# Patient Record
Sex: Female | Born: 1960 | Race: White | Hispanic: No | Marital: Married | Smoking: Never smoker
Health system: Southern US, Community
[De-identification: ages and names within clinical notes are randomized; demographics above are authoritative.]

## PROBLEM LIST (undated history)

## (undated) DIAGNOSIS — E538 Deficiency of other specified B group vitamins: Secondary | ICD-10-CM

## (undated) HISTORY — DX: Deficiency of other specified B group vitamins: E53.8

## (undated) HISTORY — PX: TUBAL LIGATION: SHX77

---

## 2015-06-04 DIAGNOSIS — Z8669 Personal history of other diseases of the nervous system and sense organs: Secondary | ICD-10-CM

## 2015-06-04 HISTORY — DX: Personal history of other diseases of the nervous system and sense organs: Z86.69

## 2015-10-16 DIAGNOSIS — R200 Anesthesia of skin: Secondary | ICD-10-CM

## 2015-10-16 HISTORY — DX: Anesthesia of skin: R20.0

## 2017-02-27 DIAGNOSIS — Z6823 Body mass index (BMI) 23.0-23.9, adult: Secondary | ICD-10-CM | POA: Diagnosis not present

## 2017-02-27 DIAGNOSIS — R8290 Unspecified abnormal findings in urine: Secondary | ICD-10-CM | POA: Diagnosis not present

## 2017-02-27 DIAGNOSIS — Z1231 Encounter for screening mammogram for malignant neoplasm of breast: Secondary | ICD-10-CM | POA: Diagnosis not present

## 2017-02-27 DIAGNOSIS — Z0000 Encounter for general adult medical examination without abnormal findings: Secondary | ICD-10-CM | POA: Diagnosis not present

## 2017-03-14 ENCOUNTER — Other Ambulatory Visit: Payer: Self-pay | Admitting: Family Medicine

## 2017-03-14 DIAGNOSIS — Z1231 Encounter for screening mammogram for malignant neoplasm of breast: Secondary | ICD-10-CM

## 2017-03-29 DIAGNOSIS — R399 Unspecified symptoms and signs involving the genitourinary system: Secondary | ICD-10-CM | POA: Diagnosis not present

## 2017-04-21 ENCOUNTER — Ambulatory Visit
Admission: RE | Admit: 2017-04-21 | Discharge: 2017-04-21 | Disposition: A | Discharge status: Home / Self Care | Payer: BLUE CROSS/BLUE SHIELD | Source: Ambulatory Visit | Attending: Family Medicine | Admitting: Family Medicine

## 2017-04-21 DIAGNOSIS — Z1231 Encounter for screening mammogram for malignant neoplasm of breast: Secondary | ICD-10-CM

## 2018-03-11 ENCOUNTER — Ambulatory Visit: Payer: BLUE CROSS/BLUE SHIELD | Admitting: Family Medicine

## 2018-03-11 DIAGNOSIS — K219 Gastro-esophageal reflux disease without esophagitis: Secondary | ICD-10-CM | POA: Insufficient documentation

## 2018-03-11 DIAGNOSIS — Z780 Asymptomatic menopausal state: Secondary | ICD-10-CM

## 2018-03-11 DIAGNOSIS — G43909 Migraine, unspecified, not intractable, without status migrainosus: Secondary | ICD-10-CM | POA: Insufficient documentation

## 2018-03-11 HISTORY — DX: Gastro-esophageal reflux disease without esophagitis: K21.9

## 2018-03-11 HISTORY — DX: Asymptomatic menopausal state: Z78.0

## 2018-03-11 HISTORY — DX: Migraine, unspecified, not intractable, without status migrainosus: G43.909

## 2018-03-13 ENCOUNTER — Ambulatory Visit (INDEPENDENT_AMBULATORY_CARE_PROVIDER_SITE_OTHER): Payer: BLUE CROSS/BLUE SHIELD | Admitting: Family Medicine

## 2018-03-13 ENCOUNTER — Encounter: Payer: Self-pay | Admitting: Family Medicine

## 2018-03-13 VITALS — BP 124/86 | HR 61 | Temp 98.5°F | Ht 72.0 in | Wt 168.6 lb

## 2018-03-13 DIAGNOSIS — Z0001 Encounter for general adult medical examination with abnormal findings: Secondary | ICD-10-CM

## 2018-03-13 DIAGNOSIS — Z7689 Persons encountering health services in other specified circumstances: Secondary | ICD-10-CM

## 2018-03-13 DIAGNOSIS — B779 Ascariasis, unspecified: Secondary | ICD-10-CM

## 2018-03-13 DIAGNOSIS — Z8669 Personal history of other diseases of the nervous system and sense organs: Secondary | ICD-10-CM | POA: Diagnosis not present

## 2018-03-13 DIAGNOSIS — Z0000 Encounter for general adult medical examination without abnormal findings: Secondary | ICD-10-CM

## 2018-03-13 LAB — LIPID PANEL
CHOLESTEROL: 199 mg/dL (ref 0–200)
HDL: 76.2 mg/dL (ref >39.00)
LDL Cholesterol: 110 mg/dL — ABNORMAL HIGH (ref 0–99)
NONHDL: 123.2
TRIGLYCERIDES: 65 mg/dL (ref 0.0–149.0)
Total CHOL/HDL Ratio: 3
VLDL: 13 mg/dL (ref 0.0–40.0)

## 2018-03-13 LAB — BASIC METABOLIC PANEL
BUN: 15 mg/dL (ref 6–23)
CALCIUM: 9.4 mg/dL (ref 8.4–10.5)
CO2: 31 mEq/L (ref 19–32)
Chloride: 102 mEq/L (ref 96–112)
Creatinine, Ser: 0.74 mg/dL (ref 0.40–1.20)
GFR: 86.43 mL/min (ref >60.00)
GLUCOSE: 98 mg/dL (ref 70–99)
POTASSIUM: 4.3 meq/L (ref 3.5–5.1)
SODIUM: 140 meq/L (ref 135–145)

## 2018-03-13 LAB — ALT: ALT: 10 U/L (ref 0–35)

## 2018-03-13 LAB — AST: AST: 16 U/L (ref 0–37)

## 2018-03-13 MED ORDER — MEBENDAZOLE 100 MG PO CHEW: 100.0000 mg | CHEWABLE_TABLET | Freq: Two times a day (BID) | ORAL | 2 refills | Status: AC

## 2018-03-13 NOTE — Progress Notes (Signed)
Ann Burgess is a 57 y.o. female here as a new patient to establish care with out office and for her annual physical exam. She is fasting for labs.  Her previous PCP records have been requested. Pt states she is UTD on colonoscopy, PAP, mammo. She does not receive flu vaccine d/t h/o GBS in 2017 (this was not related to flu vaccine).  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 She was told by a colleague that she likely has "worms". No blood in stool. Increased number of BM. Normal appetite. No n/v/d.  History reviewed. No pertinent past medical history.  History reviewed. No pertinent surgical history.  Social History   Socioeconomic History  . Marital status: Married    Spouse name: Not on file  . Number of children: Not on file  . Years of education: Not on file  . Highest education level: Not on file  Occupational History  . Not on file  Social Needs  . Financial resource strain: Not on file  . Food insecurity:    Worry: Not on file    Inability: Not on file  . Transportation needs:    Medical: Not on file    Non-medical: Not on file  Tobacco Use  . Smoking status: Never Smoker  . Smokeless tobacco: Never Used  Substance and Sexual Activity  . Alcohol use: Yes  . Drug use: Never  . Sexual activity: Not on file  Lifestyle  . Physical activity:    Days per week: Not on file    Minutes per session: Not on file  . Stress: Not on file  Relationships  . Social connections:    Talks on phone: Not on file    Gets together: Not on file    Attends religious service: Not on file    Active member of club or organization: Not on file    Attends meetings of clubs or organizations: Not on file    Relationship status: Not on file  . Intimate partner violence:    Fear of current or ex partner: Not on file    Emotionally abused: Not on file    Physically abused: Not on file   Forced sexual activity: Not on file  Other Topics Concern  . Not on file  Social History Narrative  . Not on file   History reviewed. No pertinent family history.  No outpatient encounter medications on file as of 03/13/2018.   No facility-administered encounter medications on file as of 03/13/2018.    Review of Systems  Constitutional: Negative for chills, fever, malaise/fatigue and weight loss.  HENT: Negative for congestion, ear pain, hearing loss, sinus pain, sore throat and tinnitus.   Eyes: Negative for blurred vision and double vision.  Respiratory: Negative for cough, shortness of breath and wheezing.   Cardiovascular: Negative for chest pain, palpitations and leg swelling.  Gastrointestinal: Negative for abdominal pain, blood in stool, constipation, diarrhea, heartburn, nausea and vomiting.  Genitourinary: Negative for dysuria, frequency and urgency.  Musculoskeletal: Negative for joint pain and myalgias.  Skin: Negative for itching and rash.  Neurological: Negative for dizziness, weakness and headaches.  Psychiatric/Behavioral: Negative for depression. The patient is not nervous/anxious and does not have insomnia.      Allergies  Allergen Reactions  . 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  Latex Other (See Comments)    unknown   BP 124/86 (BP Location: Left Arm, Patient Position: Sitting, Cuff Size: Normal)   Pulse 61   Temp 98.5 F (36.9 C) (Oral)   Ht 6' (1.829 m)   Wt 168 lb 9.6 oz (76.5 kg)   SpO2 91%   BMI 22.87 kg/m   General appearance: alert, cooperative, appears stated age and no distress Head: Normocephalic, without obvious abnormality, atraumatic Ears: normal TM's and external ear canals both ears Nose: Nares normal. Septum midline. Mucosa normal. No drainage or sinus tenderness. Throat: lips, mucosa, and tongue normal; teeth and gums normal Neck: no adenopathy, supple, symmetrical, trachea midline  and thyroid not enlarged, symmetric, no tenderness/mass/nodules Back: symmetric, no curvature. ROM normal. No CVA tenderness. Lungs: clear to auscultation bilaterally Heart: regular rate and rhythm and S1, S2 normal Abdomen: soft, non-tender; bowel sounds normal; no masses,  no organomegaly Extremities: extremities normal, atraumatic, no cyanosis or edema Pulses: 2+ and symmetric Skin: Skin color, texture, turgor normal. No rashes or lesions Neurologic: Alert and oriented X 3, normal strength and tone. Normal symmetric reflexes. Normal coordination and gait   A/P: 1. Annual physical exam   2. Encounter to establish care with new doctor   3. H/O Guillain-Barre syndrome   4. Roundworm infection    - await records from previous PCP - pt states she is UTD on colonoscopy, PAP, mammo, breast exam - declines flu vaccine d/t h/o guillain-barre syndrome - labs today - as per orders - stressed importance of regular CV exercise and healthy diet  - Rx: mebendazole 100mg  BID x 3 days with 2 RF - f/u PRN Discussed plan and reviewed medications with patient, including risks, benefits, and potential side effects. Pt expressed understand. All questions answered.

## 2018-03-13 NOTE — Patient Instructions (Signed)

## 2018-10-05 ENCOUNTER — Emergency Department (HOSPITAL_BASED_OUTPATIENT_CLINIC_OR_DEPARTMENT_OTHER)
Admission: EM | Admit: 2018-10-05 | Discharge: 2018-10-05 | Disposition: A | Discharge status: Home / Self Care | Payer: BLUE CROSS/BLUE SHIELD | Attending: Emergency Medicine | Admitting: Emergency Medicine

## 2018-10-05 ENCOUNTER — Emergency Department (HOSPITAL_BASED_OUTPATIENT_CLINIC_OR_DEPARTMENT_OTHER): Payer: BLUE CROSS/BLUE SHIELD

## 2018-10-05 ENCOUNTER — Ambulatory Visit: Payer: Self-pay

## 2018-10-05 ENCOUNTER — Encounter (HOSPITAL_BASED_OUTPATIENT_CLINIC_OR_DEPARTMENT_OTHER): Payer: Self-pay

## 2018-10-05 ENCOUNTER — Other Ambulatory Visit: Payer: Self-pay

## 2018-10-05 DIAGNOSIS — N3000 Acute cystitis without hematuria: Secondary | ICD-10-CM | POA: Diagnosis not present

## 2018-10-05 DIAGNOSIS — R413 Other amnesia: Secondary | ICD-10-CM | POA: Diagnosis not present

## 2018-10-05 DIAGNOSIS — G44219 Episodic tension-type headache, not intractable: Secondary | ICD-10-CM | POA: Diagnosis not present

## 2018-10-05 DIAGNOSIS — R51 Headache: Secondary | ICD-10-CM

## 2018-10-05 DIAGNOSIS — R519 Headache, unspecified: Secondary | ICD-10-CM

## 2018-10-05 LAB — URINALYSIS, ROUTINE W REFLEX MICROSCOPIC
Bilirubin Urine: NEGATIVE
Glucose, UA: NEGATIVE mg/dL
Ketones, ur: 5 mg/dL — AB
Nitrite: POSITIVE — AB
Protein, ur: NEGATIVE mg/dL
Specific Gravity, Urine: 1.03 (ref 1.005–1.030)
pH: 5.5 (ref 5.0–8.0)

## 2018-10-05 LAB — CBC WITH DIFFERENTIAL/PLATELET
Abs Immature Granulocytes: 0.01 10*3/uL (ref 0.00–0.07)
Basophils Absolute: 0 10*3/uL (ref 0.0–0.1)
Basophils Relative: 0 %
Eosinophils Absolute: 0 10*3/uL (ref 0.0–0.5)
Eosinophils Relative: 0 %
HCT: 39.1 % (ref 36.0–46.0)
Hemoglobin: 13.1 g/dL (ref 12.0–15.0)
Immature Granulocytes: 0 %
Lymphocytes Relative: 26 %
Lymphs Abs: 1.5 10*3/uL (ref 0.7–4.0)
MCH: 30 pg (ref 26.0–34.0)
MCHC: 33.5 g/dL (ref 30.0–36.0)
MCV: 89.5 fL (ref 80.0–100.0)
Monocytes Absolute: 0.4 10*3/uL (ref 0.1–1.0)
Monocytes Relative: 7 %
Neutro Abs: 3.8 10*3/uL (ref 1.7–7.7)
Neutrophils Relative %: 67 %
Platelets: 213 10*3/uL (ref 150–400)
RBC: 4.37 MIL/uL (ref 3.87–5.11)
RDW: 12.6 % (ref 11.5–15.5)
WBC: 5.7 10*3/uL (ref 4.0–10.5)
nRBC: 0 % (ref 0.0–0.2)

## 2018-10-05 LAB — BASIC METABOLIC PANEL
Anion gap: 7 (ref 5–15)
BUN: 20 mg/dL (ref 6–20)
CO2: 29 mmol/L (ref 22–32)
Calcium: 9.3 mg/dL (ref 8.9–10.3)
Chloride: 102 mmol/L (ref 98–111)
Creatinine, Ser: 0.77 mg/dL (ref 0.44–1.00)
GFR calc Af Amer: >60 mL/min (ref >60)
GFR calc non Af Amer: >60 mL/min (ref >60)
Glucose, Bld: 100 mg/dL — ABNORMAL HIGH (ref 70–99)
Potassium: 3.9 mmol/L (ref 3.5–5.1)
Sodium: 138 mmol/L (ref 135–145)

## 2018-10-05 LAB — URINALYSIS, MICROSCOPIC (REFLEX)

## 2018-10-05 MED ORDER — PROCHLORPERAZINE EDISYLATE 10 MG/2ML IJ SOLN
10.0000 mg | Freq: Once | INTRAMUSCULAR | Status: AC
  Administered 2018-10-05: 10 mg via INTRAVENOUS
  Filled 2018-10-05: qty 2

## 2018-10-05 MED ORDER — DIPHENHYDRAMINE HCL 50 MG/ML IJ SOLN
25.0000 mg | Freq: Once | INTRAMUSCULAR | Status: AC
  Administered 2018-10-05: 14:00:00 25 mg via INTRAVENOUS
  Filled 2018-10-05: qty 1

## 2018-10-05 MED ORDER — SODIUM CHLORIDE 0.9 % IV BOLUS
1000.0000 mL | Freq: Once | INTRAVENOUS | Status: AC
  Administered 2018-10-05: 1000 mL via INTRAVENOUS

## 2018-10-05 MED ORDER — CEPHALEXIN 500 MG PO CAPS: 500.0000 mg | ORAL_CAPSULE | Freq: Three times a day (TID) | ORAL | 0 refills | Status: AC

## 2018-10-05 NOTE — ED Notes (Signed)
To CT

## 2018-10-05 NOTE — Telephone Encounter (Signed)
Pt called c/o severe headache for the past 6 weeks. Pt stated that she is having trouble focusing on work and is having trouble doing activities at her job. Pt stated that she is concerned about the lack of focus. Pt c/o head feeling numb. Per husband smile is equal, speech is unaffected and walking is unaffected.  Advised pt to fo to the ED now for evaluation. Pt verbalized understanding and stated she will go to ED,>Hsband will be driving.  Reason for Disposition . [1] Numbness of the face, arm or leg on one side of the body AND [2] new onset    Feels like whole head is numb and is lightheaded  Answer Assessment - Initial Assessment Questions 1. LOCATION: "Where does it hurt?"      everywhere 2. ONSET: "When did the headache start?" (Minutes, hours or days)  6 weeks ago     3. PATTERN: "Does the pain come and go, or has it been constant since it started?"     Constant gradually worsened 4. SEVERITY: "How bad is the pain?" and "What does it keep you from doing?"  (e.g., Scale 1-10; mild, moderate, or severe)   - MILD (1-3): doesn't interfere with normal activities    - MODERATE (4-7): interferes with normal activities or awakens from sleep    - SEVERE (8-10): excruciating pain, unable to do any normal activities        severe 5. RECURRENT SYMPTOM: "Have you ever had headaches before?" If so, ask: "When was the last time?" and "What happened that time?"      yes 6. CAUSE: "What do you think is causing the headache?"     Pt does not know though was allergies 7. MIGRAINE: "Have you been diagnosed with migraine headaches?" If so, ask: "Is this headache similar?"      Teenager had migraines 8. HEAD INJURY: "Has there been any recent injury to the head?"     no 9. OTHER SYMPTOMS: "Do you have any other symptoms?" (fever, stiff neck, eye pain, sore throat, cold symptoms)    Confusion forgetting how to do her job and feels fuzzy, hard to focus 10. PREGNANCY: "Is there any chance you are  pregnant?" "When was your last menstrual period?"       n/a  Protocols used: HEADACHE-A-AH

## 2018-10-05 NOTE — ED Triage Notes (Signed)
Pt states over past year has been experiencing mild memory loss. Pt reports Blood in urine for past 3 months.  Had telephone visit with pmd today to sent her to ER for eval.  Upon arrival alert, oriented, denies changes in gait

## 2018-10-05 NOTE — Discharge Instructions (Signed)
Your work-up today has been reassuring, labs and CT of your head look good.  Your urine does appear to be infected please take antibiotics as directed.  I would like for you to follow-up with your primary care doctor as well as neurology for continued evaluation of your headaches and changes in memory.  Please return to the emergency department if you develop fevers, vomiting, flank pain, suddenly worsened headache, neck stiffness, vision changes, numbness, weakness or any other new or concerning symptoms.

## 2018-10-05 NOTE — Telephone Encounter (Signed)
Dr. Tamera Reason, Dr. Ashley Royalty made the call to send pt to ED.

## 2018-10-05 NOTE — ED Provider Notes (Signed)
[facility] EMERGENCY DEPARTMENT Provider Note   CSN: [id number] Arrival date & time: 10/05/18  1227    History   Chief Complaint Chief Complaint  Patient presents with  . Headache  . Hematuria    HPI Ann Burgess is a 58 y.o. female.     Ann Burgess is a 58 y.o. female history of migraines, GERD, DM Teola Bradley, who presents to the emergency department for evaluation of headache, hematuria and confusion.  Patient reports that over the past 4 months since the beginning of the year she has noticed a decline in her memory, she reports that she feels like she has a hard time remembering simple things that used to be second nature to her.  She reports that her husband often has to finish her sentences because she forgets where she is at mid thought.  She is alert and oriented for me today during history.  She also reports that she has been experiencing a headache for at least the past 6 weeks that is present across both sides of the head.  She reports it started off more mild, has never completely resolved but over the past week or so has gotten increasingly severe.  She denies associated vision changes, no nausea or vomiting, no dizziness, no numbness, tingling or weakness.  No facial asymmetry or changes in speech.  She has not had any fevers, cough, chest pain, shortness of breath, no abdominal pain.  She has noted some blood in her urine intermittently over the past several months but does not think she has been having urinary symptoms, she specifically denies any flank pain.  She called her primary care doctor regarding the symptoms today and they directed her to be ED for further evaluation, she reports before that she had not really done much for any of the symptoms and was just ignoring them and hoping they would go away.  She has never seen anyone regarding cognitive changes.     History reviewed. No pertinent past medical history.  Patient Active Problem List   Diagnosis Date  Noted  . H/O Guillain-Barre syndrome 03/13/2018  . GERD (gastroesophageal reflux disease) 03/11/2018  . Migraine headache 03/11/2018  . Postmenopausal 03/11/2018  . Numbness 10/16/2015  . Saddle anesthesia 10/16/2015    History reviewed. No pertinent surgical history.   OB History   No obstetric history on file.      Home Medications    Prior to Admission medications   Medication Sig Start Date End Date Taking? Authorizing Provider  cephALEXin (KEFLEX) 500 MG capsule Take 1 capsule (500 mg total) by mouth 3 (three) times daily for 7 days. 10/05/18 10/12/18  Dartha Lodge, PA-C    Family History History reviewed. No pertinent family history.  Social History Social History   Tobacco Use  . Smoking status: Never Smoker  . Smokeless tobacco: Never Used  Substance Use Topics  . Alcohol use: Yes  . Drug use: Never     Allergies   Influenza vaccines and Latex   Review of Systems Review of Systems  Constitutional: Negative for chills, fatigue and fever.  HENT: Negative.   Eyes: Negative for photophobia and visual disturbance.  Respiratory: Negative for cough and shortness of breath.   Cardiovascular: Negative for chest pain.  Gastrointestinal: Negative for abdominal pain, constipation, diarrhea, nausea and vomiting.  Genitourinary: Positive for hematuria. Negative for dysuria, flank pain and frequency.  Musculoskeletal: Negative for arthralgias, myalgias, neck pain and neck stiffness.  Skin: Negative for color  change and rash.  Neurological: Positive for headaches. Negative for dizziness, tremors, seizures, syncope, facial asymmetry, speech difficulty, weakness, light-headedness and numbness.  Psychiatric/Behavioral: Positive for confusion.     Physical Exam Updated Vital Signs BP (!) 150/69 (BP Location: Right Arm)   Pulse 62   Temp 98.3 F (36.8 C) (Oral)   Resp 18   Ht 6' (1.829 m)   Wt 70.8 kg   SpO2 100%   BMI 21.16 kg/m   Physical Exam Vitals signs  and nursing note reviewed.  Constitutional:      General: She is not in acute distress.    Appearance: She is well-developed and normal weight. She is not ill-appearing or diaphoretic.  HENT:     Head: Normocephalic and atraumatic.     Mouth/Throat:     Mouth: Mucous membranes are moist.     Pharynx: Oropharynx is clear.  Eyes:     General:        Right eye: No discharge.        Left eye: No discharge.     Pupils: Pupils are equal, round, and reactive to light.  Neck:     Musculoskeletal: Normal range of motion and neck supple.  Cardiovascular:     Rate and Rhythm: Normal rate and regular rhythm.     Heart sounds: Normal heart sounds. No murmur. No friction rub. No gallop.   Pulmonary:     Effort: Pulmonary effort is normal. No respiratory distress.     Breath sounds: Normal breath sounds. No wheezing or rales.     Comments: Respirations equal and unlabored, patient able to speak in full sentences, lungs clear to auscultation bilaterally Abdominal:     General: Bowel sounds are normal. There is no distension.     Palpations: Abdomen is soft. There is no mass.     Tenderness: There is no abdominal tenderness. There is no guarding.     Comments: Abdomen soft, nondistended, nontender to palpation in all quadrants without guarding or peritoneal signs, no CVA tenderness bilaterally  Musculoskeletal:        General: No deformity.  Skin:    General: Skin is warm and dry.     Capillary Refill: Capillary refill takes less than 2 seconds.     Findings: No rash.  Neurological:     Mental Status: She is alert and oriented to person, place, and time.     Coordination: Coordination normal.     Comments: Speech is clear, able to follow commands CN III-XII intact Normal strength in upper and lower extremities bilaterally including dorsiflexion and plantar flexion, strong and equal grip strength Sensation normal to light and sharp touch Moves extremities without ataxia, coordination intact  Normal finger to nose and rapid alternating movements No pronator drift  Psychiatric:        Mood and Affect: Mood normal.        Behavior: Behavior normal.      ED Treatments / Results  Labs (all labs ordered are listed, but only abnormal results are displayed) Labs Reviewed  BASIC METABOLIC PANEL - Abnormal; Notable for the following components:      Result Value   Glucose, Bld 100 (*)    All other components within normal limits  URINALYSIS, ROUTINE W REFLEX MICROSCOPIC - Abnormal; Notable for the following components:   APPearance CLOUDY (*)    Hgb urine dipstick SMALL (*)    Ketones, ur 5 (*)    Nitrite POSITIVE (*)    Leukocytes,Ua  TRACE (*)    All other components within normal limits  URINALYSIS, MICROSCOPIC (REFLEX) - Abnormal; Notable for the following components:   Bacteria, UA MANY (*)    All other components within normal limits  URINE CULTURE  CBC WITH DIFFERENTIAL/PLATELET    EKG None  Radiology Ct Head Wo Contrast  Result Date: 10/05/2018 CLINICAL DATA:  Headaches for 6 weeks. EXAM: CT HEAD WITHOUT CONTRAST TECHNIQUE: Contiguous axial images were obtained from the base of the skull through the vertex without intravenous contrast. COMPARISON:  None. FINDINGS: Brain: No evidence of acute infarction, hemorrhage, hydrocephalus, extra-axial collection or mass lesion/mass effect. Vascular: No hyperdense vessel is noted. Skull: Normal. Negative for fracture or focal lesion. Sinuses/Orbits: Minimal mucoperiosteal thickening of bilateral ethmoid sinuses are noted. The visualized orbits are normal. Other: None. IMPRESSION: No focal acute intracranial abnormality identified. Electronically Signed   By: Sherian Rein M.D.   On: 10/05/2018 14:30    Procedures Procedures (including critical care time)  Medications Ordered in ED Medications  sodium chloride 0.9 % bolus 1,000 mL (0 mLs Intravenous Stopped 10/05/18 1515)  prochlorperazine (COMPAZINE) injection 10 mg (10 mg  Intravenous Given 10/05/18 1355)  diphenhydrAMINE (BENADRYL) injection 25 mg (25 mg Intravenous Given 10/05/18 1353)     Initial Impression / Assessment and Plan / ED Course  I have reviewed the triage vital signs and the nursing notes.  Pertinent labs & imaging results that were available during my care of the patient were reviewed by me and considered in my medical decision making (see chart for details).  Patient presents reporting persistent headache over the past 6 weeks, also reporting difficulty with memory over the past 4 months and she has noted blood in her urine intermittently over the past few months as well.  On arrival she is mildly hypertensive but all other vitals are normal and she is well-appearing and in no acute distress.  She has no focal neurologic deficits on exam and is A&O x3.  She denies focal urinary symptoms but is noticed blood in her urine, she has no abdominal or flank pain on exam.  Her cognitive changes seem to be on a more chronic scale but given worsening headache will check CT Noncon of the head.  Given that this headache is been present for several weeks and gradually worsening I have low suspicion for subarachnoid hemorrhage.  No fevers, neck pain or stiffness, low suspicion for meningitis especially given that headache is been present for multiple weeks.  Discussed with Dr. Clarene Duke do not feel that additional head imaging is warranted at this time assuming initial head CT is reassuring.  Patient does not appear acutely altered or confused and these memory changes seem to be on a more chronic scale.  I do wonder if UTI could potentially be contributing given urinary symptoms.  Patient could also be starting to exhibit early signs of dementia.  Migraine cocktail given for headache  Work-up is overall reassuring, no leukocytosis and normal hemoglobin, no acute electrolyte derangements, normal renal function, urinalysis does show signs of infection, positive nitrates and  leukocytes with many bacteria present and some WBCs, no significant hematuria, urine culture sent.  CT head is unremarkable, imaging reviewed by myself and agree with radiologist findings.  Given reassuring work-up I feel that patient is stable for discharge home, will start her on Keflex for UTI and have her follow closely with her PCP, given memory changes and headaches, I think she would benefit from further evaluation with neurology  for full cognitive work-up.  Her headache is significantly improved today with treatment here.  I have discussed strict return precautions and she expresses understanding and agreement with plan.  Discharged home in good condition.  Case discussed with Dr. Clarene DukeLittle who is in agreement with plan.  Final Clinical Impressions(s) / ED Diagnoses   Final diagnoses:  Acute nonintractable headache, unspecified headache type  Memory changes  Acute cystitis without hematuria    ED Discharge Orders         Ordered    cephALEXin (KEFLEX) 500 MG capsule  3 times daily     10/05/18 1524           Dartha LodgeFord, Isaack Preble N, [place]PA-C 10/05/18 1746    Little, Ambrose Finlandachel Morgan, MD 10/06/18 [id number]

## 2018-10-07 LAB — URINE CULTURE: Culture: >=100000 — AB

## 2018-10-08 ENCOUNTER — Telehealth: Payer: Self-pay | Admitting: *Deleted

## 2018-10-08 NOTE — Telephone Encounter (Signed)
Post ED Visit - Positive Culture Follow-up  Culture report reviewed by antimicrobial stewardship pharmacist: [facility] []  Enzo Bi, Pharm.D. []  Celedonio Miyamoto, Pharm.D., BCPS AQ-ID []  Garvin Fila, Pharm.D., BCPS []  Georgina Pillion, Pharm.D., BCPS []  [place], [address].D., BCPS, AAHIVP []  Estella Husk, Pharm.D., BCPS, AAHIVP []  Lysle Pearl, PharmD, BCPS []  Phillips Climes, PharmD, BCPS []  Agapito Games, PharmD, BCPS []  Verlan Friends, PharmD []  Mervyn Gay, PharmD, BCPS []  Vinnie Level, PharmD Lenord Carbo, PharmD  [facility] []  Len Childs, PharmD []  Greer Pickerel, PharmD []  Adalberto Cole, PharmD []  Perlie Gold, Rph []  Lonell Face) Jean Rosenthal, PharmD []  Earl Many, PharmD []  Junita Push, PharmD []  Dorna Leitz, PharmD []  Terrilee Files, PharmD []  Lynann Beaver, PharmD []  Keturah Barre, PharmD []  Loralee Pacas, PharmD []  Bernadene Person, PharmD   Positive urine culture Treated with Cephalexin, organism sensitive to the same and no further patient follow-up is required at this time.  [facility] 10/08/2018, 8:29 AM

## 2019-01-28 ENCOUNTER — Other Ambulatory Visit: Payer: Self-pay

## 2019-01-29 ENCOUNTER — Encounter: Payer: Self-pay | Admitting: Family Medicine

## 2019-01-29 ENCOUNTER — Ambulatory Visit (INDEPENDENT_AMBULATORY_CARE_PROVIDER_SITE_OTHER): Payer: BC Managed Care – PPO | Admitting: Family Medicine

## 2019-01-29 VITALS — BP 100/78 | HR 84 | Temp 98.0°F | Ht 72.0 in | Wt 156.8 lb

## 2019-01-29 DIAGNOSIS — R4189 Other symptoms and signs involving cognitive functions and awareness: Secondary | ICD-10-CM

## 2019-01-29 NOTE — Progress Notes (Signed)
Ann Burgess is a 58 y.o. female  Chief Complaint  Patient presents with  . Hospitalization Follow-up    neurology consult?     HPI: Ann Copasatricia Garciamartinez is a 58 y.o. female here for f/u on 8 mo h/o cognitive declines/changes. She has noticed a decline in her memory, she reports that she feels like she has a hard time remembering simple things that used to be second nature to her. She reports that her husband often has to finish her sentences because she forgets where she is at mid thought. She works in IT x 31 years and states in the past few months she finds herself having to make and look at her notes to recall when to do and how to manage/resolve certain issues. She is getting work done but it takes longer to do so. Pt was seen in the ER on 5/4 for above issues as well as headache x 6 wks and gross hematuria. CT head was done and WNL. Pt was given Rx for keflex and advised to see neuro for full cognitive eval as an outpatient.  No past medical history on file.  No past surgical history on file.  Social History   Socioeconomic History  . Marital status: Married    Spouse name: Not on file  . Number of children: Not on file  . Years of education: Not on file  . Highest education level: Not on file  Occupational History  . Not on file  Social Needs  . Financial resource strain: Not on file  . Food insecurity    Worry: Not on file    Inability: Not on file  . Transportation needs    Medical: Not on file    Non-medical: Not on file  Tobacco Use  . Smoking status: Never Smoker  . Smokeless tobacco: Never Used  Substance and Sexual Activity  . Alcohol use: Yes  . Drug use: Never  . Sexual activity: Not on file  Lifestyle  . Physical activity    Days per week: Not on file    Minutes per session: Not on file  . Stress: Not on file  Relationships  . Social Musicianconnections    Talks on phone: Not on file    Gets together: Not on file    Attends religious service: Not on file   Active member of club or organization: Not on file    Attends meetings of clubs or organizations: Not on file    Relationship status: Not on file  . Intimate partner violence    Fear of current or ex partner: Not on file    Emotionally abused: Not on file    Physically abused: Not on file    Forced sexual activity: Not on file  Other Topics Concern  . Not on file  Social History Narrative  . Not on file    No family history on file.   Immunization History  Administered Date(s) Administered  . Influenza, Seasonal, Injecte, Preservative Fre 02/24/2014, 02/20/2015  . Td 08/02/2003  . Tdap 10/17/2011    No outpatient encounter medications on file as of 01/29/2019.   No facility-administered encounter medications on file as of 01/29/2019.      ROS: Pertinent positives and negatives noted in HPI. Remainder of ROS non-contributory   Allergies  Allergen Reactions  . 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 Latex Other (See Comments)    unknown    BP  100/78   Pulse 84   Temp 98 F (36.7 C) (Oral)   Ht 6' (1.829 m)   Wt 156 lb 12.8 oz (71.1 kg)   SpO2 99%   BMI 21.27 kg/m   Physical Exam  Constitutional: She is oriented to person, place, and time. She appears well-developed and well-nourished. No distress.  Cardiovascular: Normal rate and regular rhythm.  Pulmonary/Chest: Effort normal and breath sounds normal. No respiratory distress.  Neurological: She is alert and oriented to person, place, and time. She has normal reflexes. She exhibits normal muscle tone. Coordination normal.  Psychiatric: She has a normal mood and affect. Her behavior is normal.     A/P:  1. Cognitive decline - x 6-73mo - most noticeable at work which she has been doing x 31 years - TSH - Vitamin B12 - RPR - Comprehensive metabolic panel - pt would like to be called with results - Ambulatory referral to Neurology

## 2019-01-30 LAB — COMPREHENSIVE METABOLIC PANEL
ALT: 6 IU/L (ref 0–32)
AST: 13 IU/L (ref 0–40)
Albumin/Globulin Ratio: 2.6 — ABNORMAL HIGH (ref 1.2–2.2)
Albumin: 4.6 g/dL (ref 3.8–4.9)
Alkaline Phosphatase: 52 IU/L (ref 39–117)
BUN/Creatinine Ratio: 19 (ref 9–23)
BUN: 14 mg/dL (ref 6–24)
Bilirubin Total: 0.2 mg/dL (ref 0.0–1.2)
CO2: 27 mmol/L (ref 20–29)
Calcium: 9.3 mg/dL (ref 8.7–10.2)
Chloride: 100 mmol/L (ref 96–106)
Creatinine, Ser: 0.75 mg/dL (ref 0.57–1.00)
GFR calc Af Amer: 103 mL/min/{1.73_m2} (ref >59)
GFR calc non Af Amer: 89 mL/min/{1.73_m2} (ref >59)
Globulin, Total: 1.8 g/dL (ref 1.5–4.5)
Glucose: 86 mg/dL (ref 65–99)
Potassium: 4 mmol/L (ref 3.5–5.2)
Sodium: 138 mmol/L (ref 134–144)
Total Protein: 6.4 g/dL (ref 6.0–8.5)

## 2019-01-30 LAB — RPR: RPR Ser Ql: NONREACTIVE

## 2019-01-30 LAB — VITAMIN B12: Vitamin B-12: 348 pg/mL (ref 232–1245)

## 2019-01-30 LAB — TSH: TSH: 1.29 u[IU]/mL (ref 0.450–4.500)

## 2019-02-02 ENCOUNTER — Encounter: Payer: Self-pay | Admitting: Family Medicine

## 2019-02-25 ENCOUNTER — Other Ambulatory Visit: Payer: Self-pay

## 2019-02-25 ENCOUNTER — Encounter: Payer: Self-pay | Admitting: Neurology

## 2019-02-25 ENCOUNTER — Telehealth (INDEPENDENT_AMBULATORY_CARE_PROVIDER_SITE_OTHER): Payer: BC Managed Care – PPO | Admitting: Neurology

## 2019-02-25 VITALS — Ht 72.0 in | Wt 160.0 lb

## 2019-02-25 DIAGNOSIS — R413 Other amnesia: Secondary | ICD-10-CM

## 2019-02-25 NOTE — Progress Notes (Signed)
Virtual Visit via Video Note The purpose of this virtual visit is to provide medical care while limiting exposure to the novel coronavirus.    Consent was obtained for video visit:  Yes.   Answered questions that patient had about telehealth interaction:  Yes.   I discussed the limitations, risks, security and privacy concerns of performing an evaluation and management service by telemedicine. I also discussed with the patient that there may be a patient responsible charge related to this service. The patient expressed understanding and agreed to proceed.  Pt location: Home Physician Location: office Name of referring provider:  Ronnald Nian, DO I connected with Isac Sarna at patients initiation/request on 02/25/2019 at 10:30 AM EDT by video enabled telemedicine application and verified that I am speaking with the correct person using two identifiers. Pt MRN:  [id number] Pt DOB:  1961-05-12 Video Participants:  Isac Sarna   History of Present Illness:  This is a pleasant 58 year old right-handed woman with no significant past medical history, presenting for evaluation of memory loss. She feels memory changes started 3-4 months ago, PCP notes indicate she started noticing it at the beginning of the year. 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 She has noticed word-finding difficulties where her husband would finish sentences for her. She denies getting lost driving. No missed bills. She denies misplacing things frequently or leaving the faucet running. She does not cook. She wonders about ADD, her 2 children take medication for it and her daughter thinks she has it. She does not when younger she had to work harder because she had difficulty concentrating. She feels her mood is good, she is just frustrated with not being able to remember things, and feels it is getting worse. No  clear paranoia or hallucinations. She endorses her work has been very stressful, more stressful recently. Her parents have memory changes in their 58s. She denies any history of significant head injuries. She rarely drinks alcohol.  She denies any headaches, dizziness, diplopia, dysarthria, dysphagia, neck/back pain, focal numbness/tingling/weakness, bowel/bladder dysfunction. No anosmia, tremors, no falls. Sleep is okay, when she is stressed with work, she may stay up because they "sit on my brain." She had bloodwork last month with B12 of 348, she has started B12 supplements and is not sure there is much difference.   I personally reviewed head CT without contrast done 10/2018 for headaches, no acute changes seen.  Laboratory Data: Lab Results  Component Value Date   TSH 1.290 01/29/2019   Lab Results  Component Value Date   [id number] 01/29/2019     PAST MEDICAL HISTORY: Past Medical History:  Diagnosis Date  . Vitamin B12 deficiency     PAST SURGICAL HISTORY: Past Surgical History:  Procedure Laterality Date  . TUBAL LIGATION      MEDICATIONS: Current Outpatient Medications on File Prior to Visit  Medication Sig Dispense Refill  . Multiple Vitamins-Minerals (CENTRUM ADULTS PO) Take by mouth daily.    . vitamin B-12 (CYANOCOBALAMIN) 1000 MCG tablet Take 1,000 mcg by mouth daily.     No current facility-administered medications on file prior to visit.     ALLERGIES: Allergies  Allergen Reactions  . 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 Latex Other (See Comments)    unknown    FAMILY HISTORY:  History reviewed. No pertinent family history.    Observations/Objective:   Vitals:   02/25/19 0817  Weight: 160 lb (72.6 kg)  Height: 6' (1.829 m)   GEN:  The patient appears stated age and is in NAD.  Neurological examination: Patient is awake, alert, oriented x 3. No aphasia or dysarthria. Reduced fluency (10 F  words in 1 minute, nl >11), able to follow commands.  Remote memory intact. Able to name and repeat. She initially drew clock without numbers 11 and 12, then corrected herself. Cranial nerves: Extraocular movements intact with no nystagmus. No facial asymmetry. Motor: moves all extremities symmetrically, at least anti-gravity x 4. No incoordination on finger to nose testing. Gait: narrow-based and steady, able to tandem walk adequately. Negative Romberg test.  Montreal Cognitive Assessment  02/25/2019  Visuospatial/ Executive (0/5) 4  Naming (0/3) 3  Attention: Read list of digits (0/2) 2  Attention: Read list of letters (0/1) 1  Attention: Serial 7 subtraction starting at 100 (0/3) 2  Language: Repeat phrase (0/2) 2  Language : Fluency (0/1) 0  Abstraction (0/2) 2  Delayed Recall (0/5) 0  Orientation (0/6) 6  Total 22    Assessment and Plan:   This is a pleasant 58 year old right-handed woman with no significant past medical history, presenting for evaluation of memory changes that started several months ago. Her neurological exam (although limited on video) is non-focal, MOCA score today 22/30 (nl > 26/30). We discussed different causes of memory loss. She is on B12 supplements, continue current dose. MRI brain without contrast will be ordered to assess for underlying structural abnormality and assess vascular load. We discussed how stress can cause cognitive changes, she endorses a lot of stress at work. Neurocognitive testing will be ordered to further evaluate memory concerns. We discussed the importance of control of vascular risk factors, physical exercise, and brain stimulation exercises for brain health. Follow-up after tests, she knows to call for any changes.    Follow Up Instructions:   -I discussed the assessment and treatment plan with the patient. The patient was provided an opportunity to ask questions and all were answered. The patient agreed with the plan and demonstrated an  understanding of the instructions.   The patient was advised to call back or seek an in-person evaluation if the symptoms worsen or if the condition fails to improve as anticipated.    Van Clines, MD

## 2019-03-25 ENCOUNTER — Ambulatory Visit
Admission: RE | Admit: 2019-03-25 | Discharge: 2019-03-25 | Disposition: A | Discharge status: Home / Self Care | Payer: BC Managed Care – PPO | Source: Ambulatory Visit | Attending: Neurology | Admitting: Neurology

## 2019-03-25 DIAGNOSIS — R410 Disorientation, unspecified: Secondary | ICD-10-CM | POA: Diagnosis not present

## 2019-03-25 DIAGNOSIS — R413 Other amnesia: Secondary | ICD-10-CM

## 2019-03-30 ENCOUNTER — Telehealth: Payer: Self-pay

## 2019-03-30 NOTE — Telephone Encounter (Signed)
Pt informed of the MRI Brain results. No concerns at this time.

## 2019-03-30 NOTE — Telephone Encounter (Signed)
-----   Message from Cameron Sprang, MD sent at 03/30/2019  8:42 AM EDT ----- Pls let her know MRI brain is normal, no evidence of tumor, stroke, or bleed, thanks

## 2019-09-22 ENCOUNTER — Other Ambulatory Visit: Payer: Self-pay

## 2019-09-22 ENCOUNTER — Encounter: Payer: Self-pay | Admitting: Family Medicine

## 2019-09-22 ENCOUNTER — Ambulatory Visit (INDEPENDENT_AMBULATORY_CARE_PROVIDER_SITE_OTHER): Payer: BC Managed Care – PPO | Admitting: Family Medicine

## 2019-09-22 ENCOUNTER — Encounter: Payer: Self-pay | Admitting: Counselor

## 2019-09-22 VITALS — BP 118/68 | HR 76 | Temp 97.3°F | Ht 72.0 in | Wt 153.4 lb

## 2019-09-22 DIAGNOSIS — R4189 Other symptoms and signs involving cognitive functions and awareness: Secondary | ICD-10-CM | POA: Diagnosis not present

## 2019-09-22 NOTE — Patient Instructions (Signed)
Call neurology to schedule appt - [phone] Referral placed to neuropsychology Dr. Milbert Coulter - that office will call you to schedule an appt

## 2019-09-22 NOTE — Progress Notes (Signed)
Ann Burgess is a 59 y.o. female  Chief Complaint  Patient presents with  . Memory Loss    pt stated memory loss thats getting worse in last year//pt had head scan a year ago no findings//pt complains of a static sounds thats in her ears like a bug in the ear    HPI: Ann Burgess is a 59 y.o. female here with her husband and complains of worsening memory issues x 1 year. She has normal head CT in 10/2018, MRI in 03/2019. She saw Dr. Delice Lesch virtually in 02/2019. Notes from that televisit indicate a referral for neurocognitive testing with neuropsychology Dr. Melvyn Novas. Pt does not recall this and it was never done.   Work is most impacted. 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 Material and processes are essentially the same but she struggles to remember and has to write everything down. Coworkers have noticed. Pt is slower to complete work and has to work longer hours to complete same volume of work. This is stressful.  Husband often has to finishes her sentences. Pt also forgets names of friends who she has known for years.  Husband cooks and drives pt mostly everywhere - always has.   Pt notes a "faint sizzling" sound that is ever-present. She is more aware of it during the day. No hearing changes. No ear pain.   Mother with dementia - repeats self and asks same questions.   Past Medical History:  Diagnosis Date  . Vitamin B12 deficiency     Past Surgical History:  Procedure Laterality Date  . TUBAL LIGATION      Social History   Socioeconomic History  . Marital status: Married    Spouse name: Not on file  . Number of children: 2  . Years of education: Not on file  . Highest education level: Not on file  Occupational History  . Not on file  Tobacco Use  . Smoking status: Never Smoker  . Smokeless tobacco: Never Used  Substance and Sexual Activity  . Alcohol use: Yes  . Drug use: Never  . Sexual activity: Yes    Partners: Male  Other Topics Concern  . Not  on file  Social History Narrative   Lives with husband in a two story home      Right handed      Highest level of edu- assoc.   Social Determinants of Health   Financial Resource Strain:   . Difficulty of Paying Living Expenses:   Food Insecurity:   . Worried About Charity fundraiser in the Last Year:   . Arboriculturist in the Last Year:   Transportation Needs:   . Film/video editor (Medical):   Marland Kitchen Lack of Transportation (Non-Medical):   Physical Activity:   . Days of Exercise per Week:   . Minutes of Exercise per Session:   Stress:   . Feeling of Stress :   Social Connections:   . Frequency of Communication with Friends and Family:   . Frequency of Social Gatherings with Friends and Family:   . Attends Religious Services:   . Active Member of Clubs or Organizations:   . Attends Archivist Meetings:   Marland Kitchen Marital Status:   Intimate Partner Violence:   . Fear of Current or Ex-Partner:   . Emotionally Abused:   Marland Kitchen Physically Abused:   . Sexually Abused:     History reviewed. No pertinent family history.   Immunization  History  Administered Date(s) Administered  . Influenza, Seasonal, Injecte, Preservative Fre 02/24/2014, 02/20/2015  . Td 08/02/2003  . Tdap 10/17/2011    Outpatient Encounter Medications as of 09/22/2019  Medication Sig  . Multiple Vitamins-Minerals (CENTRUM ADULTS PO) Take by mouth daily.  . vitamin B-12 (CYANOCOBALAMIN) 1000 MCG tablet Take 1,000 mcg by mouth daily.   No facility-administered encounter medications on file as of 09/22/2019.     ROS: Pertinent positives and negatives noted in HPI. Remainder of ROS non-contributory    Allergies  Allergen Reactions  . 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 Latex Other (See Comments)    unknown    BP 118/68   Pulse 76   Temp (!) 97.3 F (36.3 C) (Tympanic)   Ht 6' (1.829 m)   Wt 153 lb 6.4 oz (69.6 kg)   SpO2 99%   BMI  20.80 kg/m   Physical Exam  Constitutional: She is oriented to person, place, and time. She appears well-developed and well-nourished. No distress.  Pulmonary/Chest: No respiratory distress.  Neurological: She is alert and oriented to person, place, and time. Coordination normal.  Psychiatric: She has a normal mood and affect. Her behavior is normal.  Does not repeat herself but husband does speak for pt and finish her sentences at times. Pt answers with "I don't remember" about 50% of the time. Pt appears stressed/anxious.     A/P:  1. Cognitive decline - normal MRI in 03/2019 - pt to call to schedule f/u with neurology Dr. Karel Jarvis - Ambulatory referral to Neuropsychology - referral for neurocognitive testing - note for work provided  I personally spent 45 min with the patient today obtaining hpi, doing exam, and discussing evaluation and plan going forward.  This visit occurred during the SARS-CoV-2 public health emergency.  Safety protocols were in place, including screening questions prior to the visit, additional usage of staff PPE, and extensive cleaning of exam room while observing appropriate contact time as indicated for disinfecting solutions.

## 2019-10-12 ENCOUNTER — Other Ambulatory Visit: Payer: Self-pay | Admitting: Family Medicine

## 2019-10-12 DIAGNOSIS — Z1231 Encounter for screening mammogram for malignant neoplasm of breast: Secondary | ICD-10-CM

## 2019-11-02 DIAGNOSIS — F0280 Dementia in other diseases classified elsewhere without behavioral disturbance: Secondary | ICD-10-CM

## 2019-11-02 HISTORY — DX: Dementia in other diseases classified elsewhere, unspecified severity, without behavioral disturbance, psychotic disturbance, mood disturbance, and anxiety: F02.80

## 2019-11-11 ENCOUNTER — Ambulatory Visit: Payer: BC Managed Care – PPO

## 2019-11-11 ENCOUNTER — Encounter: Payer: Self-pay | Admitting: Counselor

## 2019-11-11 ENCOUNTER — Ambulatory Visit (INDEPENDENT_AMBULATORY_CARE_PROVIDER_SITE_OTHER): Payer: BC Managed Care – PPO | Admitting: Counselor

## 2019-11-11 ENCOUNTER — Other Ambulatory Visit: Payer: Self-pay

## 2019-11-11 DIAGNOSIS — F4329 Adjustment disorder with other symptoms: Secondary | ICD-10-CM | POA: Diagnosis not present

## 2019-11-11 DIAGNOSIS — F09 Unspecified mental disorder due to known physiological condition: Secondary | ICD-10-CM

## 2019-11-11 DIAGNOSIS — R413 Other amnesia: Secondary | ICD-10-CM

## 2019-11-11 NOTE — Progress Notes (Signed)
   Psychometrist Note   Cognitive testing was administered to Ann Burgess by Lamar Benes, B.S. (Technician) under the supervision of Alphonzo Severance, Psy.D., ABN. Ms. Christenbury was able to tolerate all test procedures. Dr. Nicole Kindred met with the patient as needed to manage any emotional reactions to the testing procedures (if applicable). Rest breaks were offered.    The battery of tests administered was selected by Dr. Nicole Kindred with consideration to the patient's current level of functioning, the nature of her symptoms, emotional and behavioral responses during the interview, level of literacy, observed level of motivation/effort, and the nature of the referral question. This battery was communicated to the psychometrist. Communication between Dr. Nicole Kindred and the psychometrist was ongoing throughout the evaluation and Dr. Nicole Kindred was immediately accessible at all times. Dr. Nicole Kindred provided supervision to the technician on the date of this service, to the extent necessary to assure the quality of all services provided.    Ann Burgess will return in approximately one week for an interactive feedback session with Dr. Nicole Kindred, at which time female test performance, clinical impressions, and treatment recommendations will be reviewed in detail. The patient understands she can contact our office should she require our assistance before this time.   A total of 145 minutes of billable time were spent with Ann Burgess by the technician, including test administration and scoring time. Billing for these services is reflected in Dr. Les Pou note.   This note reflects time spent with the psychometrician and does not include test scores, clinical history, or any interpretations made by Dr. Nicole Kindred. The full report will follow in a separate note.

## 2019-11-11 NOTE — Progress Notes (Signed)
NEUROPSYCHOLOGICAL EVALUATION [facility]  Burgess Name: Ann Burgess MRN: [id number] Date of Birth: 11/15/1960 Age: 59 y.o. Education: 15 years  Referral Circumstances and Background Information  Ann Burgess is a 59 y.o., right-hand dominant, married woman with a history of Guillain-Barre syndrome, migraine headaches, and memory loss since early 2020 that she feels have been worsening since they were first noticed. She consulted with Dr. Karel Jarvis on 02/25/2019, who noted a MoCA of 22, obtained an MRI (which was normal per radiology), and referred her for neuropsychological testing. Ann appointment was never scheduled and she was re-referred by Dr. Barron Alvine, her PCP, at a recent office visit.    On interview, Ann Burgess corroborated Ann difficulties noted about. 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 Her husband was somewhat inconsistent and stated that he isn't particularly concerned but then eventually admitted that he has noticed some changes and it's hard to talk about. Her problems are largely limited to work, and there have been some changes, although she reported that she does have a hard time doing things now that she has been doing "for years" that have not changed. She was having sufficient difficulty that she notified her supervisor, who encouraged her to take breaks and gave her a few days off, but did not significantly reduce her workload or modify her job duties. There are some word finding problems. They largely denied any repeating or forgetting conversations they have had, etc. She has implemented organizational strategies and uses extensive notes and her phone. Ann Burgess and her husband largely denied any significant personality changes, and there are no visual hallucinations, or delusions. With respect to mood, they reported that her stress level is high. Her job has always been  stressful and she feels like it has only gotten more stressful over time. She thinks that is related to her problems, not changes at Ann company. She stated that she feels overwhelmed and has been tearful lately. She doesn't want to lose her job and is worried that she will. She is on call every 8 weeks, for a week, and she needs to be available 24 hours a day. Sometimes, her stress disturbs her sleep, about 2 times per week. Ann rest of Ann time, she sleeps 8 hours a night. She denied any changes in her weight or appetite. She feels like her energy is normal for her.    With respect to functioning, it sounds like Ann main changes Ann Burgess notices are at work. She doesn't have many changes at home. Ann Burgess estimated that she is probably working from 7:30am - 6:30pm and she works through lunch. She thinks she is working about 15-20% more than in Ann past to do a similar amount of work. She is using notes and alarms on her phone. They largely denied many changes with respect to driving, managing money, or other day-to-day activities. She has been having difficulties remembering to take her B12. She doesn't cook, her husband does Ann cooking. She is still scheduling and recalling her own medical appointments, she is able to use Ann community as needed.   Past Medical History and Review of Relevant Studies   Burgess Active Problem List   Diagnosis Date Noted  . H/O Guillain-Barre syndrome 03/13/2018  . GERD (gastroesophageal reflux disease) 03/11/2018  . Migraine headache 03/11/2018  . Postmenopausal 03/11/2018  . Numbness 10/16/2015  . Saddle anesthesia  10/16/2015   Review of Neuroimaging and Relevant Medical History: :  Ann Burgess has an MRI of Ann brain from 03/25/2019, which shows mild volume loss, perhaps a bit more than expected given her young age. There is just a bit more volume loss in Ann left hemisphere, visible over Ann dorsal portions of Ann convexities and in Ann lateral ventricles.  Overall, Ann imaging is nonspecific.  Ann Burgess has been supplementing B12 due to borderline low B12, which has not caused significant changes in her perception of her cognitive abilities.    Current Outpatient Medications  Medication Sig Dispense Refill  . Multiple Vitamins-Minerals (CENTRUM ADULTS PO) Take by mouth daily.    . vitamin B-12 (CYANOCOBALAMIN) 1000 MCG tablet Take 1,000 mcg by mouth daily.     No current facility-administered medications for this visit.   No family history on file.  There is a questionable family history of dementia, it sounds like her mother is quite forgetful, although she hasn't gotten diagnosed and she is 7. There is no  family history of psychiatric illness.  Psychosocial History  Developmental, Educational and Employment History: Ann Burgess is a native of [place] and her family moved to [place] when she was a teenager. Ann Burgess reported that she was a good student who was never held back, didn't have any learning difficulties, and graduated high school. She went on to study management at [employer]. She stopped prior to earning her degree, she stated she was "burned out," but also that it just "wasn't for me." She started working at a bank, fixing things, and had aptitude at [employer]. She eventually was moved into IT full time and earned some training certifications. 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 She has been with Ann same company for approximately 32 years.   Psychiatric History: Ann Burgess denied any history of formal psychiatric treatment or psychiatric consultation.   Substance Use History: Ann Burgess drinks alcohol occasionally, she doesn't use any illicit drugs, and she does not smoke.   Relationship History and Living Cimcumstances: Ann Burgess and her husband have been together 5 years, they have been married for several years. Ann Burgess was married once before, she  has two children who live in Ann area and are involved. It sounds like Ann Burgess's daughter is somewhat concerned.   Mental Status and Behavioral Observations  Sensorium/Arousal: Ann Burgess's level of arousal was awake and alert. Hearing and vision were adequate with correct (wore glasses) for testing purposes. Orientation: Ann Burgess was alert and fully oriented but had to think hard and initially stated she did not know Ann date. Well oriented to person, place, and situation.  Appearance: Dressed in appropriate, casual clothing with reasonable grooming and hygiene.  Behavior: Ann Burgess was pleasant and appropriate, appeared to be struggling during some of Ann cognitive testing.  Speech/language: Normal in rate, rhythm, and volume. There were some word finding pauses.  Gait/Posture: Not well observed, appeared normal on observation within Ann clinic.  Movement: No overt signs/symptoms of movement disorder Social Comportment: Ann Burgess was pleasant and appropriate Mood: "Stressed"  Affect: Mood congruent, did not appear depressed, did appear stressed and there were moments of tearfulness when discussing mood as of late.  Thought process/content: Ann Burgess's thought process was logical, linear, and goal-directed. She did have some repeating and forgetting during verbal fluency tasks.  Safety: No safety concerns identified at today's encounter.  Insight: Fair,  Burgess is appropriately concerned about Ann changes she notices.   Montreal Cognitive Assessment  11/11/2019 02/25/2019  Visuospatial/ Executive (0/5) 2 4  Naming (0/3) 3 3  Attention: Read list of digits (0/2) 2 2  Attention: Read list of letters (0/1) 1 1  Attention: Serial 7 subtraction starting at 100 (0/3) 2 2  Language: Repeat phrase (0/2) 1 2  Language : Fluency (0/1) 1 0  Abstraction (0/2) 1 2  Delayed Recall (0/5) 0 0  Orientation (0/6) 6 6  Total 19 22  Adjusted Score (based on education) 19 -   Test Procedures   Wide Range Achievement Test - 4 Word Reading Reynolds' Intellectual Screening Test Wechsler Adult Intelligence Scale - IV  Digit Span  Arithmetic  Symbol Search  Coding Neuropsychological Assessment Battery  Memory Module  Visual Journalist, newspaper ACS Word Choice Ann Dot Counting Test Controlled Oral Word Association (F-A-S) Physicist, medical (Animals) Trail Making Test A & B Wisconsin Card Sorting Test - 64 Delis-Kaplan Executive Functions System  Color-word Interference Burgess Health Questionnaire - 9 GAD-7  Plan  Ann Burgess was seen for a psychiatric diagnostic evaluation and neuropsychological testing. She is complaining of memory problems that started just over a year ago and have been progressively worsening. 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 Her husband initially presented as unconcerned although on further discussion, he admits to noticing some changes but they are hard to talk about. Ann Burgess reported that there have been changes at work but she is also having difficulties doing tasks that she used to do well. She is screening at an MCI level on Ann MoCA and it is a bit lower than her last performance in 2020. Testing is likely to be helpful in this case to better delineate deficits. Full and complete note with impressions, recommendations, and interpretation of test data to follow.   Bettye Boeck Roseanne Reno, PsyD, ABN Clinical Neuropsychologist  Informed Consent and Coding/Compliance  Risks and benefits of Ann evaluation were discussed with Ann Burgess prior to all testing procedures. I conducted a clinical interview and neuropsychological testing (at least two tests) with Ann Burgess and Ann Burgess, B.S. (Technician) assisted me in administering additional test procedures. Ann Burgess was able to tolerate Ann testing procedures and Ann Burgess (and/or  family if applicable) is likely to benefit from further follow up to receive Ann diagnosis and treatment recommendations, which will be rendered at Ann next encounter. Billing below reflects technician time, my direct face-to-face time with Ann Burgess, time spent in test administration, and time spent in professional activities including but not limited to: neuropsychological test interpretation, integration of neuropsychological test data with clinical history, report preparation, treatment planning, care coordination, and review of diagnostically pertinent medical history or studies.   Services associated with this encounter: Clinical Interview [phone]) plus 60 minutes (61443; Neuropsychological Evaluation by Professional)  120 minutes (15400; Neuropsychological Evaluation by Professional, Adl.) 23 minutes (86761; Test Administration by Professional) 30 minutes (95093; Neuropsychological Testing by Technician) 115 minutes (26712; Neuropsychological Testing by Technician, Adl.)

## 2019-11-16 NOTE — Progress Notes (Signed)
NEUROPSYCHOLOGICAL TEST SCORES [facility]  Patient Name: Ann Burgess MRN: [id number] Date of Birth: [birth year] Age: 59 y.o. Education: 15 years  Measurement properties of test scores: IQ, Index, and Standard Scores (SS): Mean = 100; Standard Deviation = 15 Scaled Scores (Ss): Mean = 10; Standard Deviation = 3 Z scores (Z): Mean = 0; Standard Deviation = 1 T scores (T); Mean = 50; Standard Deviation = 10  TEST SCORES:    Note: This summary of test scores accompanies the interpretive report and should not be interpreted by unqualified individuals or in isolation without reference to the report. Test scores are relative to age, gender, and educational history as available and appropriate.   Performance Validity        ACS: Raw  Descriptor      Word Choice 39 Below Expectation       Raw  Descriptor  The Dot Counting Test: 9 Within Expectation      Embedded Measures: Raw  Descriptor      WAIS-IV Reliable Digit Span 10 Within Expectation      WAIS-IV Reliable Digit Span Revised 14 Within Expectation      Mental Status Screening     Total Score Descriptor  MoCA 19 Mild Dementia      Expected Functioning        Wide Range Achievement Test: Standard/Scaled Score Percentile      Word Reading 99 47      Reynolds Intellectual Screening Test Standard/T-score Percentile      Guess What 42 22      Odd Item Out 55 70  RIST Index 98 45      Attention/Processing Speed        Wechsler Adult Intelligence Scale - IV: Standard/Scaled Score Percentile  Working Memory Index 92 30      Digit Span 10 50          Digit Span Forward 11 63          Digit Span Backward 11 63          Digit Span Sequencing 9 37      Arithmetic 7 16  Processing Speed Index 86 18      Symbol Search 9 37      Coding 6 9      Language        Neuropsychological Assessment Battery (Language Module, Form 1): T-score Percentile      Naming   (25) 24 <1      Verbal Fluency:         Controlled Oral  Word Association (F-A-S) 40 16      Semantic Fluency (Animals) 22 <1      Memory:        Neuropsychological Assessment Battery (Memory Module, Form 1): T-score/Standard Score Percentile  Memory Index (MEM): 51 <1      List Learning           List A Immediate Recall   (3 , 4 , 4) 22 <1         List B Immediate Recall   (3) 38 12         List A Short Delayed Recall   (0) 19 <1         List A Long Delayed Recall   (0) 20 <1         List A Long Delayed Yes/No Recognition Hits   (8) --- 2         List  A Long Delayed Yes/No Recognition False Alarms   (7) --- 4         List A Recognition Discriminability Index --- 1      Shape Learning           Immediate Recognition   (4 , 5 , 4) 39 14         Delayed Recognition   (3) 30 2         Delayed Forced-Choice Recognition Hits   (9) --- 86         Delayed Forced-Choice Recognition False Alarms   (1) --- 38         Delayed Forced-Choice Recognition Discriminability --- 73     Story Learning           Immediate Recall   (8, 11) 19 <1         Delayed Recall   (0) 25 1      Daily Living Memory            Immediate Recall   (15, 11) 25 1          Delayed Recall   (2, 0) 19 <1          Recognition Hits   (8) --- 25      Visuospatial/Constructional Functioning        Neuropsychological Assessment Battery (Visuospatial Module) T-score Percentile      Visual Discrimination 60 84      Design Construction 54 66      Executive Functioning        Wisconsin Card Sorting Test - 64: T-score Percentile      Categories --- 6 - 10 %ile      Total Errors 32 4      Perseverative Errors 41 19      Nonperseverative Errors 29 2      Conceptual Level Responses 28 2      Trail Making Test: T-Score Percentile      Part A 48 42      Part B 44 27      Rating Scales         Raw Score Descriptor  Patient Health Questionnaire - 9 6 Mild Depression  GAD-7 14 Moderate Anxiety  Quick Dementia Rating System        Sum of Boxes 2 MCI      Total Score 2.5 MCI    Caileigh Canche V. Nicole Kindred PsyD, [place] Clinical Neuropsychologist

## 2019-11-18 ENCOUNTER — Other Ambulatory Visit: Payer: Self-pay

## 2019-11-18 ENCOUNTER — Encounter: Payer: BC Managed Care – PPO | Admitting: Counselor

## 2019-11-18 NOTE — Progress Notes (Signed)
NEUROPSYCHOLOGICAL EVALUATION [facility]  Patient Name: Ann Burgess MRN: [id number] Date of Birth: Aug 14, 1960 Age: 59 y.o. Education: 15 years  Clinical Impressions  Ann Burgess is a 59 y.o., right-hand dominant, married woman with a history of memory problems since 2020 that she feels have been worsening over time. On interview, she reported that she has been having progressive difficulties keeping up at work and that she is having a hard time doing things that she has been doing "for years" that have not changed. She is having sufficient difficulties that she brought the matter to the attention of her supervisor and thinks that overall, she is spending about 15-20% more time doing the same amount of work. She is worried she will lose her job. She has some stress related to this and is not sleeping well about 2 times a week. Her husband had a hard time discussing the changes but sounds as though he has noticed. Her MRI from 03/25/2019 isn't clearly abnormal, although to my eye there does appear to be somewhat more atrophy than might be typically expected given her young age.   On neuropsychological testing, there is suggestion of a substantial decline in memory abilities with additional low scores on measures of naming, semantic fluency, and on select measures of processing speed and executive functioning. Her memory problems do have a storage pattern. She performed within gross limits of normal on measures of working memory and did well on measures of visuospatial abilities. She reported moderate levels of anxiety that are to some extent understandable given her current circumstances and screened in the mild range for depressive symptoms. Her husband rates her as falling at an MCI level of dysfunction.   Ann Burgess is demonstrating cognitive problems that are concerning for an incipient neurodegenerative condition, which is most likely Alzheimer's disease. I think that very mild early  onset dementia is the best classification of her current severity, because she is clearly having problems functioning at work. Her test data are supportive of disability status. She retains independence in other areas. Further workup not likely necessary but biomarker confirmation with CSF could be considered given her young age.     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 You also had difficulties with visual object naming (I.e., word retrieval) and with semantic fluency (e.g., generating words in a category). You are still functioning adequately with most instrumental activities but you are having problems maintaining your functioning on the job, and thus I do think you have reached a dementia level of function. The severity of your dementia, however, is very mild.    Dementia refers to a group of syndromes where multiple areas of ability are damaged in the brain, such as memory, thinking, judgment, and behavior, and most commonly refers to age related causes of dementia that cause worsening in these abilities over time. Alzheimer's disease is the most common form of dementia in people over the age of 90. Not all dementias are Alzheimer's disease, but all Alzheimer's disease is dementia. When dementia is due to an underlying condition affecting the brain, such as Alzheimer's disease, there is progression over time, which typically procedes gradually over many years.   Your neuropsychological test findings are highly consistent with expectations in the setting  of Alzheimer's clinical syndrome, and I am concerned that Alzheimer's disease is the cause of your problem. If Alzheimer's is the cause of  your problems, then there will be progression over time. If having the utmost diagnostic clarity is a goal of care for you, you could discuss the pros and cons of a lumbar puncture for AD biomarkers with Dr. Delice Lesch. I do think it is needed but it would enhance our confidence that Alzheimer's is causing your problem if it were positive. If it were negative, then it is possible that it is something else.   Your test data are supportive of disability status and I would recommend that you consider filing for disability.    While we do not yet have a cure for Alzheimer's disease, it is probably best to view it like other chronic diseases that cannot be cured but can be managed (e.g., hypertension, diabetes, etc.). There are numerous interventions that have been shown to potentially delay progression and ameliorate decline, including lifestyle changes. In one study, the [facility] diet was associated with an effect size equivalent to being 7 years younger with respect to participants' cognitive performance.   There are medications that can be helpful, namely Aricept, and I would suggest that you consider a trial of this medication with Dr. Bryan Lemma.   There is now good quality evidence from at least one large scale study that a modified mediterranean diet may help slow cognitive decline. This is known as the "MIND" diet. The Mind diet is not so much a specific diet as it is a set of recommendations for things that you should and should not eat.   Foods that are ENCOURAGED on the MIND Diet:  Green, leafy vegetables: Aim for six or more servings per week. This includes kale, spinach, cooked greens and salads.  All other vegetables: Try to eat another vegetable in addition to the green leafy vegetables at least once a day. It is best to choose non-starchy vegetables because they have a lot of nutrients with a low number of calories.  Berries: Eat berries at least twice a week. There is a plethora of research on  strawberries, and other berries such as blueberries, raspberries and blackberries have also been found to have antioxidant and brain health benefits.  Nuts: Try to get five servings of nuts or more each week. The creators of the [place] don't specify what kind of nuts to consume, but it is probably best to vary the type of nuts you eat to obtain a variety of nutrients. Peanuts are a legume and do not fall into this category.  Olive oil: Use olive oil as your main cooking oil. There may be other heart-healthy alternatives such as algae oil, though there is not yet sufficient research upon which to base a formal recommendation.  Whole grains: Aim for at least three servings daily. Choose minimally processed grains like oatmeal, quinoa, brown rice, whole-wheat pasta and 100% whole-wheat bread.  Fish: Eat fish at least once a week. It is best to choose fatty fish like salmon, sardines, trout, tuna and mackerel for their high amounts of omega-3 fatty acids.  Beans: Include beans in at least four meals every week. This includes all beans, lentils and soybeans.  Poultry: Try to eat chicken or [place] at least twice a week. Note that fried chicken is not encouraged on the MIND diet.  Wine: Aim for no more than one glass of alcohol daily. Both red and white wine may benefit the  brain. However, much research has focused on the red wine compound resveratrol, which may help protect against Alzheimer's disease.  Foods that are DISCOURAGED on the MIND Diet: Butter and margarine: Try to eat less than 1 tablespoon (about 14 grams) daily. Instead, try using olive oil as your primary cooking fat, and dipping your bread in olive oil with herbs.  Cheese: The MIND diet recommends limiting your cheese consumption to less than once per week.  Red meat: Aim for no more than three servings each week. This includes all beef, pork, lamb and products made from these meats.  [place] food: The MIND diet highly discourages fried food,  especially the kind from fast-food restaurants. Limit your consumption to less than once per week.  Pastries and sweets: This includes most of the processed junk food and desserts you can think of. Ice cream, cookies, brownies, snack cakes, donuts, candy and more. Try to limit these to no more than four times a week.  Exercise is one of the best medicines for promoting health and maintaining cognitive fitness at all stages in life. Exercise probably has the largest documented effect on brain health and performance of any intervention. Studies have shown that even previously sedentary individuals who start exercising as late as age 6 show a significant survival benefit as compared to their non-exercising peers. In the [place], the current guidelines are for 30 minutes of moderate exercise per day, but increasing your activity level less than that may also be helpful. You do not have to get your 30 minutes of exercise in one shot and exercising for short periods of time spread throughout the day can be helpful. Go for several walks, learn to dance, or do something else you enjoy that gets your body moving. Of course, if you have an underlying medical condition or there is any question about whether it is safe for you to exercise, you should consult a medical treatment provider prior to beginning exercise.   Test Findings  Test scores are summarized in additional documentation associated with this encounter. Test scores are relative to age, gender, and educational history as available and appropriate. There were no concerns about performance validity despite one memory-dependent validity indicator below the level of expectations. This likely reflects cognitive impairment as opposed to a validity problem, as Ann Burgess had 3 other indicators within the normal range.   General Intellectual Functioning/Achievement:  Performance on single word reading was average, which was also commensurate with her RIST  index and suggests average as a good standard of comparison for her cognitive test data. She demonstrated somewhat stronger performance on the visually as compared to the verbally mediated portion of the RIST, which may in part be due to word finding problems during the verbal subtest.   Attention and Processing Efficiency: Performance on indicators of attention fell at an average level, overall, on the Working Memory Index of the WAIS-IV. Average performance was obtained on indicators of digit repetition forward, digit repetition backward, and digit resequencing in ascending order. Mental solving of arithmetical word problems without paper and pencil was low average.   Processing speed fell at a low average level on the relevant index from the WAIS-IV. She demonstrated average performance on a symbol matching to sample task emphasizing efficient visual scanning and efficient visual matching. Performance on timed number-symbol coding was lower, and fell at an unusually low level. This may reflect executive and/or selective attention task demands.   Language: Performance on language measures was notable for  extremely low visual object confrontation naming and semantic fluency, suggesting semantic issues. By contrast her generation of words in response to the letters F-A-S was low average.   Visuospatial Function: Performance on measures of visuospatial abilities was very good and within normal limits with high average visual discrimination and attention to detail and average constructional performance.   Learning and Memory: Performance on memory measures fell markedly below expectations for Ann Burgess. The discrepancy between her Memory Index (MEM) score and her RIST index would be expected in less than 5% of healthy individuals. The profile is concerning for a memory storage problem, although she did benefit from recognition cuing on shape learning and daily living memory, suggesting some residual  abilities.   In the verbal realm, immediate recall for material including a 12-item word list, short story, and brief daily-living type information was extremely low followed by virtually no information recalled in any case. Recognition cuing did not appreciably increase her performance for the words from the list with 8 hits and 7 false positive errors. She did do better with recognition cues for the daily-living information, achieving and average score.   In the visual realm, immediate recognition for a series of designs that are difficult to encode was low average followed by unusually low delayed recall. In this case, she did well under a yes/no recognition format with average performance.   Executive Functions: Performance was variable on executive measures with an overall impression of some decline. She completed an unusually low to low average number of categories on the [facility] with an unusually low errors score and a low average perseverative errors score. Conceptual level responses were extremely low. She did better on generating words in response to the letters, F-A-S, which was low average. Alternating sequencing of numbers and letters of the alphabet was average.   Rating Scale(s): Ann Burgess reported mild levels of depressive symptoms and moderate levels of anxiety symptoms on self-rating scales. Her husband characterized her as functioning at a mild dementia level.   Bettye Boeck Roseanne Reno PsyD, ABN Clinical Neuropsychologist

## 2019-11-19 ENCOUNTER — Telehealth: Payer: Self-pay | Admitting: Family Medicine

## 2019-11-19 NOTE — Telephone Encounter (Signed)
Patients husband dropped off forms from [facility] [employer] to be completed by provider. Please fax once completed. Placed in provider's folder up front.   Thank you, Antony Blackbird

## 2019-11-22 NOTE — Telephone Encounter (Signed)
Received forms and placed in Dr. Salena Saner basket.

## 2019-11-23 NOTE — Telephone Encounter (Signed)
Patient's husband called to check status of forms. I let him know Dr. Barron Alvine will not be in the office until tomorrow.

## 2019-11-24 NOTE — Telephone Encounter (Signed)
Patient's husband called again today to check status of paperwork. He needs this completed by Friday. Please call him when paperwork has been sent to [facility].

## 2019-11-25 DIAGNOSIS — Z0279 Encounter for issue of other medical certificate: Secondary | ICD-10-CM

## 2019-11-25 NOTE — Telephone Encounter (Signed)
I spoke with pt's husband and he informed me that pt would need a full absence from work/short term disability.

## 2019-11-25 NOTE — Telephone Encounter (Signed)
Pt and husband dropped by to check on status and see if there was anything they needed to do, Dr Barron Alvine said she is working on it but that if she does need something from them she will give them a call. Pt did also say that they were told that they also needed midical records from June sent with the documents, They said any from this office and also [facility], Please advise. They said it needs to be done asap or it could be denied

## 2019-11-25 NOTE — Telephone Encounter (Signed)
Forms completed

## 2019-11-26 NOTE — Telephone Encounter (Signed)
Forms have been faxed 11/25/19.  Pt's husband aware.

## 2019-12-03 ENCOUNTER — Ambulatory Visit (INDEPENDENT_AMBULATORY_CARE_PROVIDER_SITE_OTHER): Payer: BC Managed Care – PPO | Admitting: Neurology

## 2019-12-03 ENCOUNTER — Encounter: Payer: Self-pay | Admitting: Neurology

## 2019-12-03 ENCOUNTER — Other Ambulatory Visit: Payer: Self-pay

## 2019-12-03 VITALS — BP 115/68 | HR 86 | Ht 72.0 in | Wt 153.6 lb

## 2019-12-03 DIAGNOSIS — F0280 Dementia in other diseases classified elsewhere without behavioral disturbance: Secondary | ICD-10-CM

## 2019-12-03 DIAGNOSIS — G300 Alzheimer's disease with early onset: Secondary | ICD-10-CM | POA: Diagnosis not present

## 2019-12-03 NOTE — Patient Instructions (Signed)
Good to see you. Follow-up in 6 months, call for any changes.   FALL PRECAUTIONS: Be cautious when walking. Scan the area for obstacles that may increase the risk of trips and falls. When getting up in the mornings, sit up at the edge of the bed for a few minutes before getting out of bed. Consider elevating the bed at the head end to avoid drop of blood pressure when getting up. Walk always in a well-lit room (use night lights in the walls). Avoid area rugs or power cords from appliances in the middle of the walkways. Use a walker or a cane if necessary and consider physical therapy for balance exercise. Get your eyesight checked regularly.  FINANCIAL OVERSIGHT: Supervision, especially oversight when making financial decisions or transactions is also recommended as difficulties arise.  HOME SAFETY: Consider the safety of the kitchen when operating appliances like stoves, microwave oven, and blender. Consider having supervision and share cooking responsibilities until no longer able to participate in those. Accidents with firearms and other hazards in the house should be identified and addressed as well.  DRIVING: Regarding driving, in patients with progressive memory problems, driving will be impaired. We advise to have someone else do the driving if trouble finding directions or if minor accidents are reported. Independent driving assessment is available to determine safety of driving.  ABILITY TO BE LEFT ALONE: If patient is unable to contact 911 operator, consider using LifeLine, or when the need is there, arrange for someone to stay with patients. Smoking is a fire hazard, consider supervision or cessation. Risk of wandering should be assessed by caregiver and if detected at any point, supervision and safe proof recommendations should be instituted.  MEDICATION SUPERVISION: Inability to self-administer medication needs to be constantly addressed. Implement a mechanism to ensure safe administration  of the medications.  RECOMMENDATIONS FOR ALL PATIENTS WITH MEMORY PROBLEMS: 1. Continue to exercise (Recommend 30 minutes of walking everyday, or 3 hours every week) 2. Increase social interactions - continue going to [place] and enjoy social gatherings with friends and family 3. Eat healthy, avoid fried foods and eat more fruits and vegetables 4. Maintain adequate blood pressure, blood sugar, and blood cholesterol level. Reducing the risk of stroke and cardiovascular disease also helps promoting better memory. 5. Avoid stressful situations. Live a simple life and avoid aggravations. Organize your time and prepare for the next day in anticipation. 6. Sleep well, avoid any interruptions of sleep and avoid any distractions in the bedroom that may interfere with adequate sleep quality 7. Avoid sugar, avoid sweets as there is a strong link between excessive sugar intake, diabetes, and cognitive impairment We discussed the Mediterranean diet, which has been shown to help patients reduce the risk of progressive memory disorders and reduces cardiovascular risk. This includes eating fish, eat fruits and green leafy vegetables, nuts like almonds and hazelnuts, walnuts, and also use olive oil. Avoid fast foods and fried foods as much as possible. Avoid sweets and sugar as sugar use has been linked to worsening of memory function.

## 2019-12-03 NOTE — Progress Notes (Signed)
NEUROLOGY FOLLOW UP OFFICE NOTE  Ann Burgess [id number] Oct 31, 1960  HISTORY OF PRESENT ILLNESS: I had the pleasure of seeing Ann Burgess in follow-up in the neurology clinic on 12/03/2019.  The patient was last seen in September 2020 for memory loss. She is accompanied by her husband who helps supplement the history today.  Records and images were personally reviewed where available.  I personally reviewed MRI brain without contrast done 03/2019 with no acute changes, normal exam. She had Neuropsychological testing in June 2021 which indicated substantial decline in memory abilities with additional low scores on measures of naming, semantic fluency, and on select measures of processing speed and executive functioning. Memory problems do have a storage problem. Findings concerning for an incipient neurodegenerative condition, most likely Alzheimer's disease, very mild early onset dementia. Findings were discussed today, she states she has been good, sometimes with word-finding difficulties. Her husband drives all the time, she did drive short distances with no difficulties. Since Neuropsych testing, her husband has become more in tuned and notice she is forgetting things such as forgetting words and misplacing things. She had been struggling at work, she works in IT with new processes/softward, and could not remember them the next day, not knowing what to do. They report that Dr. Roseanne Reno had filled out paperwork taking her out of work,she is currently on short-term disability. She continues to manage bills at home, but they are condensing them and her husband will start taking over. She states her mood is great now that she does not have stress from work. No paranoia or hallucinations. She reports tinnitus for the past 2-3 years, she hears a "sizzle" constantly with a very low pitched sound. She has headaches with weather changes. No falls. Sleep is good.    History on Initial Assessment 02/25/2019:  This is a pleasant 59 year old right-handed woman with no significant past medical history, presenting for evaluation of memory loss. She feels memory changes started 3-4 months ago, PCP notes indicate she started noticing it at the beginning of the year. 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 She has noticed word-finding difficulties where her husband would finish sentences for her. She denies getting lost driving. No missed bills. She denies misplacing things frequently or leaving the faucet running. She does not cook. She wonders about ADD, her 2 children take medication for it and her daughter thinks she has it. She does not when younger she had to work harder because she had difficulty concentrating. She feels her mood is good, she is just frustrated with not being able to remember things, and feels it is getting worse. No clear paranoia or hallucinations. She endorses her work has been very stressful, more stressful recently. Her parents have memory changes in their 27s. She denies any history of significant head injuries. She rarely drinks alcohol.  She denies any headaches, dizziness, diplopia, dysarthria, dysphagia, neck/back pain, focal numbness/tingling/weakness, bowel/bladder dysfunction. No anosmia, tremors, no falls. Sleep is okay, when she is stressed with work, she may stay up because they "sit on my brain." She had bloodwork last month with B12 of 348, she has started B12 supplements and is not sure there is much difference.   I personally reviewed head CT without contrast done 10/2018 for headaches, no acute changes seen.   PAST MEDICAL HISTORY: Past Medical History:  Diagnosis Date  .  Vitamin B12 deficiency     MEDICATIONS: Current Outpatient Medications on File Prior to Visit  Medication Sig Dispense Refill  . Multiple Vitamins-Minerals (CENTRUM ADULTS PO) Take by mouth  daily.    . vitamin B-12 (CYANOCOBALAMIN) 1000 MCG tablet Take 1,000 mcg by mouth daily.     No current facility-administered medications on file prior to visit.    ALLERGIES: Allergies  Allergen Reactions  . 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 No pertinent family history.  SOCIAL HISTORY: Social History   Socioeconomic History  . Marital status: Married    Spouse name: Not on file  . Number of children: 2  . Years of education: Not on file  . Highest education level: Not on file  Occupational History  . Not on file  Tobacco Use  . Smoking status: Never Smoker  . Smokeless tobacco: Never Used  Vaping Use  . Vaping Use: Never used  Substance and Sexual Activity  . Alcohol use: Yes  . Drug use: Never  . Sexual activity: Yes    Partners: Male  Other Topics Concern  . Not on file  Social History Narrative   Lives with husband in a two story home      Right handed      Highest level of edu- assoc.   Social Determinants of Health   Financial Resource Strain:   . Difficulty of Paying Living Expenses:   Food Insecurity:   . Worried About Programme researcher, broadcasting/film/video in the Last Year:   . Barista in the Last Year:   Transportation Needs:   . Freight forwarder (Medical):   Marland Kitchen Lack of Transportation (Non-Medical):   Physical Activity:   . Days of Exercise per Week:   . Minutes of Exercise per Session:   Stress:   . Feeling of Stress :   Social Connections:   . Frequency of Communication with Friends and Family:   . Frequency of Social Gatherings with Friends and Family:   . Attends Religious Services:   . Active Member of Clubs or Organizations:   . Attends Banker Meetings:   Marland Kitchen Marital Status:   Intimate Partner Violence:   . Fear of Current or Ex-Partner:   . Emotionally Abused:   Marland Kitchen Physically Abused:   . Sexually Abused:     PHYSICAL EXAM: Vitals:    12/03/19 1523  BP: 115/68  Pulse: 86  SpO2: 97%   General: No acute distress Head:  Normocephalic/atraumatic Skin/Extremities: No rash, no edema Neurological Exam: alert and oriented to person, place, and time. No aphasia or dysarthria. Fund of knowledge is appropriate.  Recent and remote memory are intact.  Attention and concentration are normal.  Cranial nerves: Pupils equal, round. Extraocular movements intact No facial asymmetry. Motor: Bulk and tone normal, muscle strength 5/5 throughout with no pronator drift. Deep tendon reflexes 2+ throughout, toes downgoing.  Finger to nose testing intact.  Gait narrow-based and steady, able to tandem walk adequately.  Romberg negative.   IMPRESSION: This is a pleasant 59 yo RH woman with no significant past medical history,who presented with memory changes. MRI brain unremarkable. Neuropsychological testing done last month indicated early onset Alzheimer's disease. Findings discussed today, we discussed CSF testing for biomarkers, they feel this would not change management and would like to hold off for now. We discussed medications used in dementia, such as Donepezil, she  would like to hold off for now. We discussed the importance of control of vascular risk factors, physical exercise, and brain stimulation exercises for brain health. Follow-up in 6 months, they know to call for any changes.    Thank you for allowing me to participate in her care.  Please do not hesitate to call for any questions or concerns.   Patrcia Dolly, M.D.   CC: Dr. Barron Alvine

## 2019-12-07 ENCOUNTER — Telehealth: Payer: Self-pay | Admitting: Family Medicine

## 2019-12-07 NOTE — Telephone Encounter (Signed)
Patient is spouse is calling and wanted to see if Dr. Barron Alvine speak to someone regarding paperwork Dr. Salena Saner completed, please advise. CB is [phone]

## 2019-12-08 NOTE — Telephone Encounter (Signed)
Patient husband called back to check status of previous message left, please advise. CB is [phone]

## 2020-02-21 IMAGING — MR MR HEAD W/O CM
10 series · 48 of 48 positions shown · non-contrast
Comparison: Head CT 10/05/2018

CLINICAL DATA: Memory loss and confusion over the last 8 months.

EXAM:
MRI HEAD WITHOUT CONTRAST
TECHNIQUE: Multiplanar, multiecho pulse sequences of the brain and surrounding
structures were obtained without intravenous contrast.

[Series 2: T1 · sagittal · 5.0mm · 0.45mm/px · 3 of 21 slices shown]
[im 1/21]
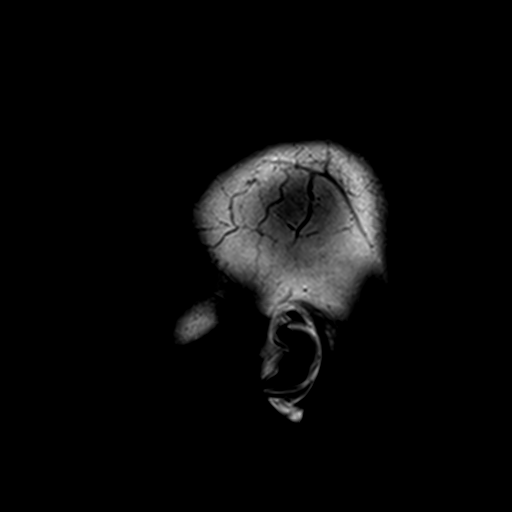
[im 11/21]
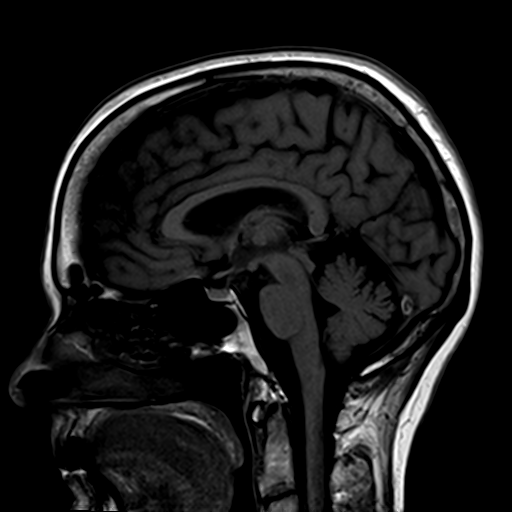
[im 21/21]
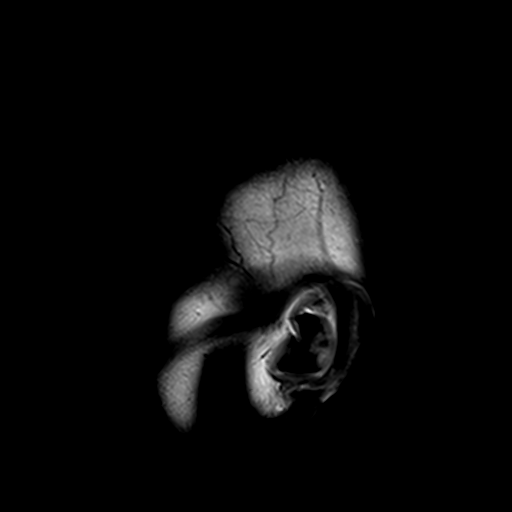

[Series 3: DWI · axial · 3.0mm · 1.80mm/px · z∈[-59,+99]mm · 9 of 107 slices shown (1 of 4)]
[im 1/107]
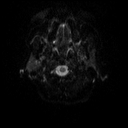
[im 14/107]
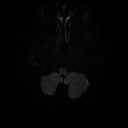
[im 27/107]
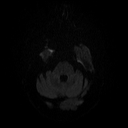
[im 40/107]
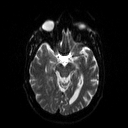
[im 54/107]
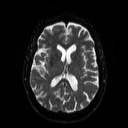
[im 67/107]
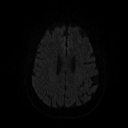
[im 80/107]
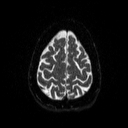
[im 93/107]
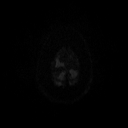
[im 107/107]
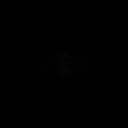

[Series 4: DWI · axial · 3.0mm · 1.80mm/px · z∈[-59,+99]mm · 5 of 54 slices shown (2 of 4)]
[im 1/54]
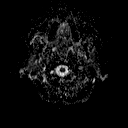
[im 14/54]
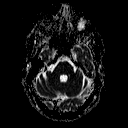
[im 27/54]
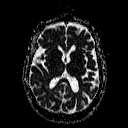
[im 40/54]
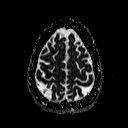
[im 54/54]
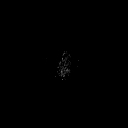

[Series 5: DWI · coronal · 5.0mm · 1.80mm/px · 6 of 75 slices shown (3 of 4)]
[im 1/75]
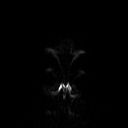
[im 15/75]
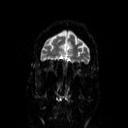
[im 30/75]
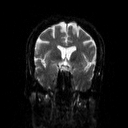
[im 45/75]
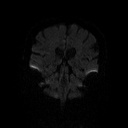
[im 60/75]
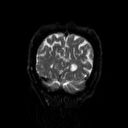
[im 75/75]
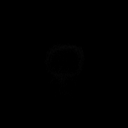

[Series 6: DWI · coronal · 5.0mm · 1.80mm/px · 3 of 38 slices shown (4 of 4)]
[im 1/38]
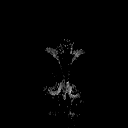
[im 19/38]
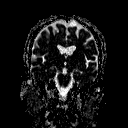
[im 38/38]
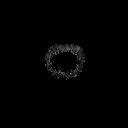

[Series 7: T2 · axial · 5.0mm · 0.51mm/px · z∈[-63,+97]mm · 2 of 24 slices shown (1 of 2)]
[im 1/24]
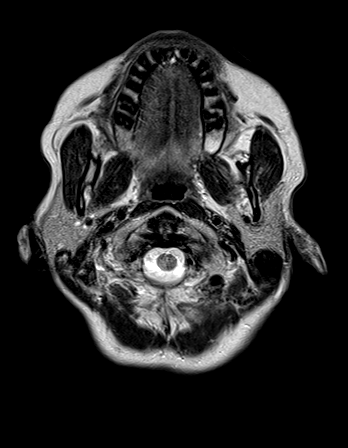
[im 24/24]
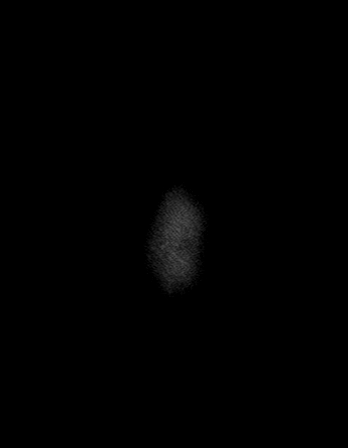

[Series 8: FLAIR · axial · 3.0mm · 0.45mm/px · z∈[-53,+100]mm · 3 of 34 slices shown]
[im 1/34]
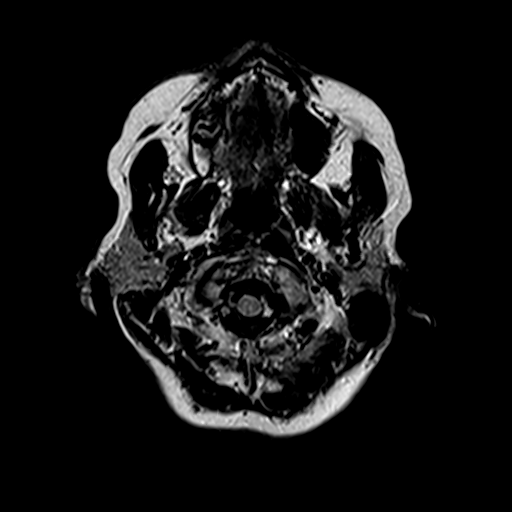
[im 17/34]
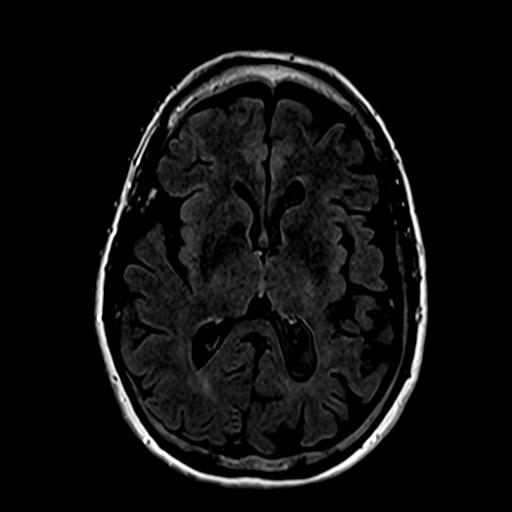
[im 34/34]
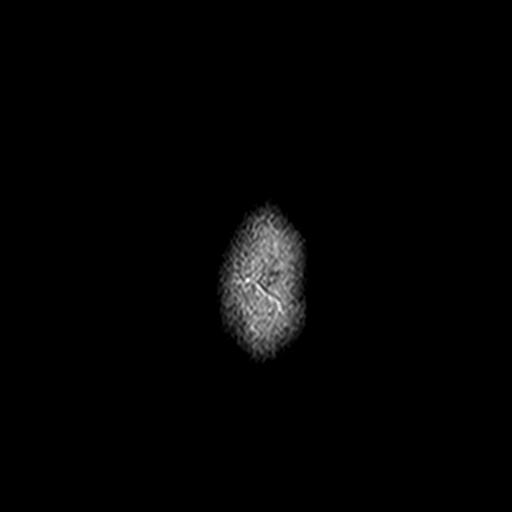

[Series 10: swi_images · axial · 4.0mm · 0.90mm/px · z∈[-49,+107]mm · 3 of 40 slices shown]
[im 1/40]
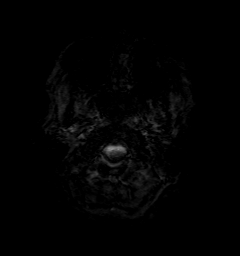
[im 20/40]
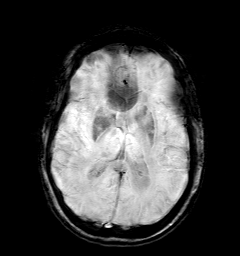
[im 40/40]
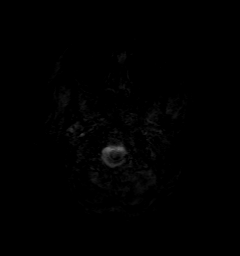

[Series 11: t1_mpr_tra · axial · 1.0mm · 0.71mm/px · z∈[-43,+100]mm · 12 of 144 slices shown]
[im 1/144]
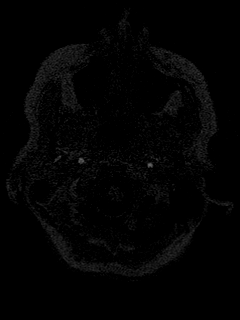
[im 14/144]
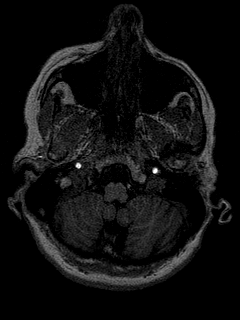
[im 27/144]
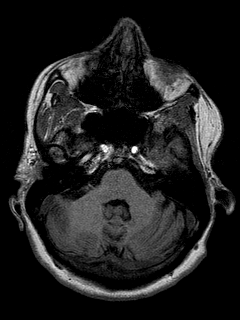
[im 40/144]
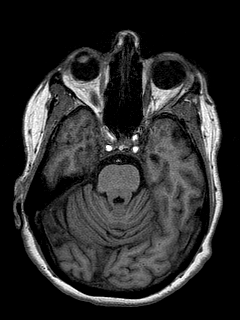
[im 53/144]
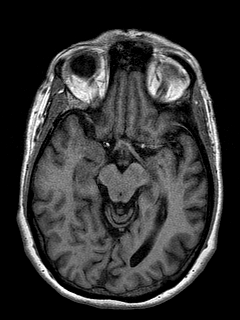
[im 66/144]
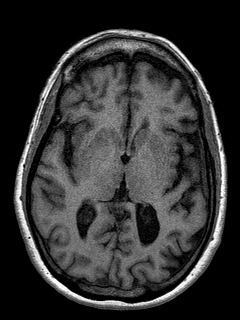
[im 79/144]
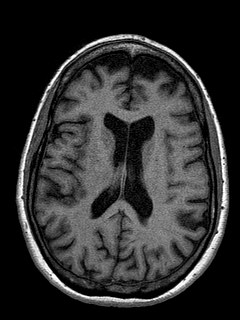
[im 92/144]
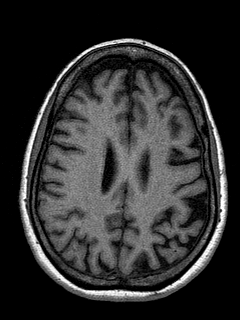
[im 105/144]
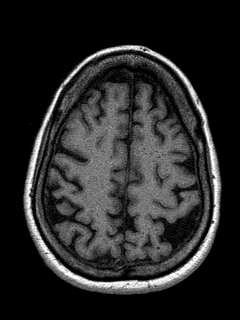
[im 118/144]
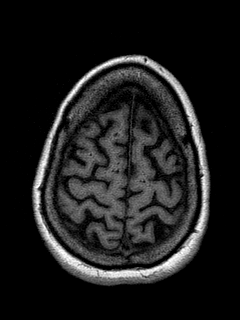
[im 131/144]
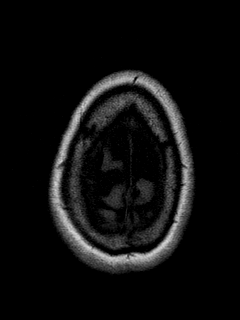
[im 144/144]
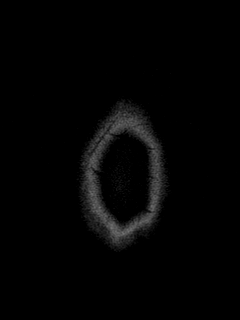

[Series 12: T2 · coronal · 5.0mm · 0.45mm/px · 2 of 27 slices shown (2 of 2)]
[im 1/27]
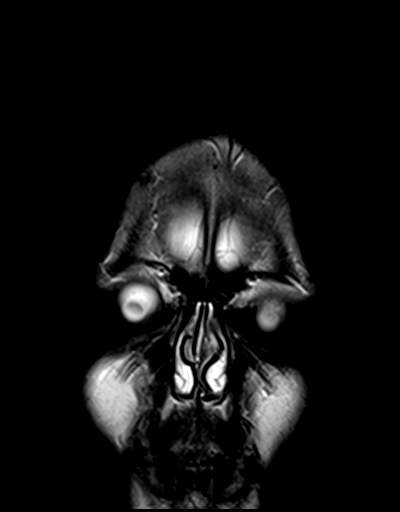
[im 27/27]
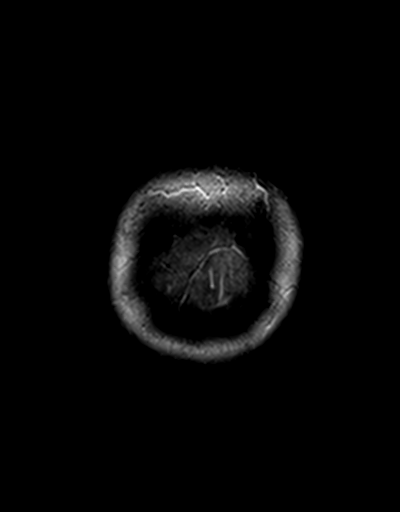

[48 of 48 positions shown; findings below may reference images not displayed]

FINDINGS: Brain: The brain has a normal appearance without evidence of
malformation, accelerated volume loss, old or acute small or large
vessel infarction, mass lesion, hemorrhage, hydrocephalus or
extra-axial collection.

Vascular: Major vessels at the base of the brain show flow. Venous
sinuses appear patent.

Skull and upper cervical spine: Normal.

Sinuses/Orbits: Clear/normal.

Other: None significant.
IMPRESSION: Normal examination. No abnormality seen to explain the presenting
symptoms.

## 2020-04-04 ENCOUNTER — Other Ambulatory Visit: Payer: Self-pay

## 2020-04-05 ENCOUNTER — Ambulatory Visit (INDEPENDENT_AMBULATORY_CARE_PROVIDER_SITE_OTHER): Payer: BC Managed Care – PPO | Admitting: Family Medicine

## 2020-04-05 ENCOUNTER — Encounter: Payer: Self-pay | Admitting: Family Medicine

## 2020-04-05 ENCOUNTER — Other Ambulatory Visit (HOSPITAL_COMMUNITY)
Admission: RE | Admit: 2020-04-05 | Discharge: 2020-04-05 | Disposition: A | Discharge status: Home / Self Care | Payer: BC Managed Care – PPO | Source: Ambulatory Visit | Attending: Family Medicine | Admitting: Family Medicine

## 2020-04-05 VITALS — BP 114/68 | HR 75 | Temp 97.5°F | Ht 72.0 in | Wt 163.0 lb

## 2020-04-05 DIAGNOSIS — R4189 Other symptoms and signs involving cognitive functions and awareness: Secondary | ICD-10-CM

## 2020-04-05 DIAGNOSIS — Z124 Encounter for screening for malignant neoplasm of cervix: Secondary | ICD-10-CM | POA: Insufficient documentation

## 2020-04-05 DIAGNOSIS — Z1321 Encounter for screening for nutritional disorder: Secondary | ICD-10-CM | POA: Diagnosis not present

## 2020-04-05 DIAGNOSIS — Z0000 Encounter for general adult medical examination without abnormal findings: Secondary | ICD-10-CM | POA: Diagnosis not present

## 2020-04-05 DIAGNOSIS — E538 Deficiency of other specified B group vitamins: Secondary | ICD-10-CM | POA: Diagnosis not present

## 2020-04-05 DIAGNOSIS — Z1231 Encounter for screening mammogram for malignant neoplasm of breast: Secondary | ICD-10-CM

## 2020-04-05 DIAGNOSIS — Z13220 Encounter for screening for lipoid disorders: Secondary | ICD-10-CM | POA: Diagnosis not present

## 2020-04-05 LAB — COMPREHENSIVE METABOLIC PANEL
ALT: 11 U/L (ref 0–35)
AST: 18 U/L (ref 0–37)
Albumin: 4.4 g/dL (ref 3.5–5.2)
Alkaline Phosphatase: 40 U/L (ref 39–117)
BUN: 14 mg/dL (ref 6–23)
CO2: 34 mEq/L — ABNORMAL HIGH (ref 19–32)
Calcium: 9.2 mg/dL (ref 8.4–10.5)
Chloride: 100 mEq/L (ref 96–112)
Creatinine, Ser: 0.75 mg/dL (ref 0.40–1.20)
GFR: 88.26 mL/min (ref >60.00)
Glucose, Bld: 87 mg/dL (ref 70–99)
Potassium: 4 mEq/L (ref 3.5–5.1)
Sodium: 138 mEq/L (ref 135–145)
Total Bilirubin: 0.6 mg/dL (ref 0.2–1.2)
Total Protein: 6.6 g/dL (ref 6.0–8.3)

## 2020-04-05 LAB — CBC
HCT: 36 % (ref 36.0–46.0)
Hemoglobin: 12.2 g/dL (ref 12.0–15.0)
MCHC: 33.8 g/dL (ref 30.0–36.0)
MCV: 88.4 fl (ref 78.0–100.0)
Platelets: 176 10*3/uL (ref 150.0–400.0)
RBC: 4.07 Mil/uL (ref 3.87–5.11)
RDW: 13.6 % (ref 11.5–15.5)
WBC: 3.6 10*3/uL — ABNORMAL LOW (ref 4.0–10.5)

## 2020-04-05 LAB — LIPID PANEL
Cholesterol: 234 mg/dL — ABNORMAL HIGH (ref 0–200)
HDL: 81.6 mg/dL (ref >39.00)
LDL Cholesterol: 142 mg/dL — ABNORMAL HIGH (ref 0–99)
NonHDL: 151.99
Total CHOL/HDL Ratio: 3
Triglycerides: 52 mg/dL (ref 0.0–149.0)
VLDL: 10.4 mg/dL (ref 0.0–40.0)

## 2020-04-05 LAB — VITAMIN D 25 HYDROXY (VIT D DEFICIENCY, FRACTURES): VITD: 23.46 ng/mL — ABNORMAL LOW (ref 30.00–100.00)

## 2020-04-05 LAB — VITAMIN B12: Vitamin B-12: 630 pg/mL (ref 211–911)

## 2020-04-05 NOTE — Progress Notes (Signed)
Ann Burgess is a 59 y.o. female  Chief Complaint  Patient presents with  . Annual Exam    CPE/labs & pap,  fasting this am. no concerns.   declinses flu and covid vaccines    HPI: Ann Burgess is a 59 y.o. female seen today for annual CPE, fasting labs, PAP.  Pt declines flu and covid vaccines.  Last PAP: no h/o abnormal PAP Last mammo: 04/2017 Last colonoscopy: 7 years ago  Diet/Exercise: well balanced diet, appetite is good; walks daily 2 miles or so Dental: appt next week Vision: 1 year ago and due for exam, wears glasses   Med refills needed today? n/a   Past Medical History:  Diagnosis Date  . Vitamin B12 deficiency     Past Surgical History:  Procedure Laterality Date  . TUBAL LIGATION      Social History   Socioeconomic History  . Marital status: Married    Spouse name: Not on file  . Number of children: 2  . Years of education: Not on file  . Highest education level: Not on file  Occupational History  . Not on file  Tobacco Use  . Smoking status: Never Smoker  . Smokeless tobacco: Never Used  Vaping Use  . Vaping Use: Never used  Substance and Sexual Activity  . Alcohol use: Yes  . Drug use: Never  . Sexual activity: Yes    Partners: Male  Other Topics Concern  . Not on file  Social History Narrative   Lives with husband in a two story home      Right handed      Highest level of edu- assoc.   Social Determinants of Health   Financial Resource Strain:   . Difficulty of Paying Living Expenses: Not on file  Food Insecurity:   . Worried About Programme researcher, broadcasting/film/video in the Last Year: Not on file  . Ran Out of Food in the Last Year: Not on file  Transportation Needs:   . Lack of Transportation (Medical): Not on file  . Lack of Transportation (Non-Medical): Not on file  Physical Activity:   . Days of Exercise per Week: Not on file  . Minutes of Exercise per Session: Not on file  Stress:   . Feeling of Stress : Not on file  Social  Connections:   . Frequency of Communication with Friends and Family: Not on file  . Frequency of Social Gatherings with Friends and Family: Not on file  . Attends Religious Services: Not on file  . Active Member of Clubs or Organizations: Not on file  . Attends Banker Meetings: Not on file  . Marital Status: Not on file  Intimate Partner Violence:   . Fear of Current or Ex-Partner: Not on file  . Emotionally Abused: Not on file  . Physically Abused: Not on file  . Sexually Abused: Not on file    No family history on file.   Immunization History  Administered Date(s) Administered  . Influenza, Seasonal, Injecte, Preservative Fre 02/24/2014, 02/20/2015  . Td 08/02/2003  . Tdap 10/17/2011    Outpatient Encounter Medications as of 04/05/2020  Medication Sig  . Multiple Vitamins-Minerals (CENTRUM ADULTS PO) Take by mouth daily.  . vitamin B-12 (CYANOCOBALAMIN) 1000 MCG tablet Take 1,000 mcg by mouth daily.   No facility-administered encounter medications on file as of 04/05/2020.     ROS: Gen: no fever, chills  Skin: no rash, itching ENT: no ear pain, ear drainage,  nasal congestion, rhinorrhea, sinus pressure, sore throat Eyes: no blurry vision, double vision Resp: no cough, wheeze,SOB Breast: no breast tenderness, no nipple discharge, no breast masses CV: no CP, palpitations, LE edema,  GI: no heartburn, n/v/d/c, abd pain GU: no dysuria, urgency, frequency, hematuria MSK: no joint pain, myalgias, back pain Neuro: no dizziness, headache, weakness, vertigo Psych: no depression, anxiety, insomnia; + cognitive decline   Allergies  Allergen Reactions  . Influenza Vaccines Other (See Comments)    History of guillain barre' syndrome 5/17    BP 114/68   Pulse 75   Temp (!) 97.5 F (36.4 C) (Temporal)   Ht 6' (1.829 m)   Wt 163 lb (73.9 kg)   SpO2 99%   BMI 22.11 kg/m   Physical Exam Constitutional:      General: She is not in acute distress.     Appearance: She is well-developed.  HENT:     Head: Normocephalic and atraumatic.     Right Ear: Tympanic membrane and ear canal normal.     Left Ear: Tympanic membrane and ear canal normal.     Nose: Nose normal.  Eyes:     Conjunctiva/sclera: Conjunctivae normal.     Pupils: Pupils are equal, round, and reactive to light.  Neck:     Thyroid: No thyromegaly.  Cardiovascular:     Rate and Rhythm: Normal rate and regular rhythm.     Heart sounds: Normal heart sounds. No murmur heard.   Pulmonary:     Effort: Pulmonary effort is normal. No respiratory distress.     Breath sounds: Normal breath sounds. No wheezing or rhonchi.  Chest:     Breasts:        Right: Normal. No mass, nipple discharge, skin change or tenderness.        Left: No mass, nipple discharge, skin change or tenderness.  Abdominal:     General: Bowel sounds are normal. There is no distension.     Palpations: Abdomen is soft. There is no mass.     Tenderness: There is no abdominal tenderness.     Hernia: A hernia is present.  Genitourinary:    Labia:        Right: No rash, tenderness or lesion.        Left: No rash, tenderness or lesion.      Vagina: No vaginal discharge, erythema, tenderness or bleeding.     Cervix: No cervical motion tenderness, discharge, friability, erythema or cervical bleeding.     Uterus: Normal.      Adnexa: Right adnexa normal and left adnexa normal.  Musculoskeletal:     Cervical back: Neck supple.     Right lower leg: No edema.     Left lower leg: No edema.  Lymphadenopathy:     Cervical: No cervical adenopathy.  Skin:    General: Skin is warm and dry.  Neurological:     Mental Status: She is alert and oriented to person, place, and time.     Motor: No abnormal muscle tone.     Coordination: Coordination normal.  Psychiatric:        Behavior: Behavior normal.      A/P:  1. Screening for cervical cancer - Cytology - PAP( [place])  2. Annual physical exam - discussed  importance of regular CV exercise, healthy diet, adequate sleep - UTD on dental and vision  - PAP today, UTD on colonoscopy, mammo referral placed today - h/o GBS so declines flu and covid  vaccines - CBC - Comprehensive metabolic panel - Lipid panel - VITAMIN D 25 Hydroxy (Vit-D Deficiency, Fractures) - next CPE in 1 year  3. Screening for lipid disorders - Lipid panel  4. Encounter for vitamin deficiency screening - VITAMIN D 25 Hydroxy (Vit-D Deficiency, Fractures)  5. B12 deficiency - Vitamin B12  6. Encounter for screening mammogram for malignant neoplasm of breast - MM DIGITAL SCREENING BILATERAL; Future  7. Cognitive decline - early onset Alzheimer's, follows with neurology - husband is working on long term disability paperwork    This visit occurred during the SARS-CoV-2 public health emergency.  Safety protocols were in place, including screening questions prior to the visit, additional usage of staff PPE, and extensive cleaning of exam room while observing appropriate contact time as indicated for disinfecting solutions.

## 2020-04-07 ENCOUNTER — Other Ambulatory Visit: Payer: Self-pay | Admitting: Family Medicine

## 2020-04-07 ENCOUNTER — Encounter: Payer: Self-pay | Admitting: Family Medicine

## 2020-04-07 DIAGNOSIS — E559 Vitamin D deficiency, unspecified: Secondary | ICD-10-CM

## 2020-04-07 LAB — CYTOLOGY - PAP
Comment: NEGATIVE
Diagnosis: NEGATIVE
High risk HPV: NEGATIVE

## 2020-04-07 MED ORDER — VITAMIN D (ERGOCALCIFEROL) 1.25 MG (50000 UNIT) PO CAPS: 50000.0000 [IU] | ORAL_CAPSULE | ORAL | 2 refills | Status: DC

## 2020-05-16 ENCOUNTER — Telehealth: Payer: Self-pay | Admitting: Counselor

## 2020-05-16 NOTE — Telephone Encounter (Signed)
I received a request of information from [employer]. The release of information is unreadable. I called Ann Burgess and spoke with her and her husband, they wanted me to leave a release of information with the front desk so that I may release her neuropsychological evaluation report. Release left with front desk for them to fill out today.

## 2020-05-17 NOTE — Telephone Encounter (Signed)
Patient filled out release of information, which I have entered for scanning. Records sent to [facility] c/o Viktoriya Vecorko [phone].

## 2020-05-29 ENCOUNTER — Telehealth: Payer: Self-pay | Admitting: Neurology

## 2020-05-29 NOTE — Telephone Encounter (Signed)
Going to contact insurance back regarding dx.

## 2020-05-29 NOTE — Telephone Encounter (Signed)
No answer

## 2020-05-29 NOTE — Telephone Encounter (Signed)
Patient's husband called in stating they have filed for long term disability for her and somewhere along the way anxiety has been listed as part of her dissability and that has limited some of the possibilities. He would like to speak with someone about that.

## 2020-05-31 ENCOUNTER — Telehealth: Payer: Self-pay

## 2020-05-31 DIAGNOSIS — E559 Vitamin D deficiency, unspecified: Secondary | ICD-10-CM

## 2020-05-31 MED ORDER — VITAMIN D (ERGOCALCIFEROL) 1.25 MG (50000 UNIT) PO CAPS: 50000.0000 [IU] | ORAL_CAPSULE | ORAL | 2 refills | Status: DC

## 2020-05-31 NOTE — Telephone Encounter (Signed)
Patient's husband calling to inform office that patient does not use the Mychart and to call him for any results or other information about pt.

## 2020-06-30 ENCOUNTER — Ambulatory Visit (INDEPENDENT_AMBULATORY_CARE_PROVIDER_SITE_OTHER): Payer: BC Managed Care – PPO | Admitting: Neurology

## 2020-06-30 ENCOUNTER — Encounter: Payer: Self-pay | Admitting: Neurology

## 2020-06-30 ENCOUNTER — Other Ambulatory Visit: Payer: Self-pay

## 2020-06-30 VITALS — BP 92/67 | HR 85 | Ht 72.0 in | Wt 161.8 lb

## 2020-06-30 DIAGNOSIS — F0280 Dementia in other diseases classified elsewhere without behavioral disturbance: Secondary | ICD-10-CM

## 2020-06-30 DIAGNOSIS — G300 Alzheimer's disease with early onset: Secondary | ICD-10-CM | POA: Diagnosis not present

## 2020-06-30 MED ORDER — DONEPEZIL HCL 10 MG PO TABS: ORAL_TABLET | ORAL | 11 refills | Status: DC

## 2020-06-30 NOTE — Progress Notes (Signed)
NEUROLOGY FOLLOW UP OFFICE NOTE  Ann Burgess [id number] [birth year]  HISTORY OF PRESENT ILLNESS: I had the pleasure of seeing Ann Burgess in follow-up in the neurology clinic on 06/30/2020.  The patient was last seen 6 months ago fo rearly onset Alzheimer's disease. She is again accompanied by her husband who helps supplement the history today. Since her last visit, she feels her memory is not bad, she forgets things once in a while. Her husband reports that forgetfulness and word-finding difficulties are more noticeable now. She has a harder time completing sentences and thinking of the last word. She does not drive. She manages her medications but does not take any prescription medications. Her husband manages finances now.  He also manages meals. She misplaces things frequently such as her coat, purse, shoes, phone. Mood is fine, no paranoia or hallucinations. Anxiety has been relieved since she stopped working since it was causing quite a bit of stress. Sleep is god. She denies any headaches, dizziness, vision changes, focal numbness/tingling/weakness, bowel/bladder dysfunction, no falls.    History on Initial Assessment 02/25/2019: This is a pleasant 60 year old right-handed woman with no significant past medical history, presenting for evaluation of memory loss. She feels memory changes started 3-4 months ago, PCP notes indicate she started noticing it at the beginning of the year. 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 She has noticed word-finding difficulties where her husband would finish sentences for her. She denies getting lost driving. No missed bills. She denies misplacing things frequently or leaving the faucet running. She does not cook. She wonders about ADD, her 2 children take medication for it and her daughter thinks she has it. She does not when younger she had to work  harder because she had difficulty concentrating. She feels her mood is good, she is just frustrated with not being able to remember things, and feels it is getting worse. No clear paranoia or hallucinations. She endorses her work has been very stressful, more stressful recently. Her parents have memory changes in their 1s. She denies any history of significant head injuries. She rarely drinks alcohol.  She denies any headaches, dizziness, diplopia, dysarthria, dysphagia, neck/back pain, focal numbness/tingling/weakness, bowel/bladder dysfunction. No anosmia, tremors, no falls. Sleep is okay, when she is stressed with work, she may stay up because they "sit on my brain." She had bloodwork last month with B12 of 348, she has started B12 supplements and is not sure there is much difference.   I personally reviewed head CT without contrast done 10/2018 for headaches, no acute changes seen.  Diagnostic Data:  MRI brain without contrast done 03/2019 with no acute changes, normal exam.  Neuropsychological testing in June 2021 indicated substantial decline in memory abilities with additional low scores on measures of naming, semantic fluency, and on select measures of processing speed and executive functioning. Memory problems do have a storage problem. Findings concerning for an incipient neurodegenerative condition, most likely Alzheimer's disease, very mild early onset dementia.    PAST MEDICAL HISTORY: Past Medical History:  Diagnosis Date  . Vitamin B12 deficiency     MEDICATIONS: Current Outpatient Medications on File Prior to Visit  Medication Sig Dispense Refill  . Multiple Vitamins-Minerals (CENTRUM ADULTS PO) Take by mouth daily.    . vitamin B-12 (CYANOCOBALAMIN) 1000 MCG tablet Take 1,000 mcg by mouth daily.    Marland Kitchen  Vitamin D, Ergocalciferol, (DRISDOL) 1.25 MG (50000 UNIT) CAPS capsule Take 1 capsule (50,000 Units total) by mouth every 7 (seven) days. 5 capsule 2   No current  facility-administered medications on file prior to visit.    ALLERGIES: Allergies  Allergen Reactions  . 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 No pertinent family history.  SOCIAL HISTORY: Social History   Socioeconomic History  . Marital status: Married    Spouse name: Not on file  . Number of children: 2  . Years of education: Not on file  . Highest education level: Not on file  Occupational History  . Not on file  Tobacco Use  . Smoking status: Never Smoker  . Smokeless tobacco: Never Used  Vaping Use  . Vaping Use: Never used  Substance and Sexual Activity  . Alcohol use: Not Currently  . Drug use: Never  . Sexual activity: Yes    Partners: Male  Other Topics Concern  . Not on file  Social History Narrative   Lives with husband in a two story home      Right handed      Highest level of edu- assoc.   Social Determinants of Health   Financial Resource Strain: Not on file  Food Insecurity: Not on file  Transportation Needs: Not on file  Physical Activity: Not on file  Stress: Not on file  Social Connections: Not on file  Intimate Partner Violence: Not on file     PHYSICAL EXAM: Vitals:   06/30/20 0829  BP: 92/67  Pulse: 85  SpO2: 98%   General: No acute distress Head:  Normocephalic/atraumatic Skin/Extremities: No rash, no edema Neurological Exam: alert and oriented to person, place, month/season. Year is 2002. No aphasia or dysarthria. Fund of knowledge is appropriate.  Recent and remote memory are impaired.  Attention and concentration are normal.  MMSE 23/30 MMSE - Mini Mental State Exam 06/30/2020  Orientation to time 2  Orientation to Place 5  Registration 3  Attention/ Calculation 5  Recall 0  Language- name 2 objects 2  Language- repeat 1  Language- follow 3 step command 3  Language- read & follow direction 1  Write a sentence 1  Copy design 0  Total  score 23    Cranial nerves: Pupils equal, round. Extraocular movements intact with no nystagmus. Visual fields full.  No facial asymmetry.  Motor: Bulk and tone normal, muscle strength 5/5 throughout with no pronator drift.   Finger to nose testing intact.  Gait narrow-based and steady, no ataxia   IMPRESSION: This is a pleasant 60 yo RH woman with no significant past medical history, with early onset Alzheimer's disease. MRI brain unremarkable. MMSE today 23/30. She had previously wanted to hold off on Donepezil on last visit, discussed starting medication, including expectations and side effects from medication. She is agreeable to starting Donepezil 10mg  1/2 tablet daily for 2 weeks, then increase to 1 tablet daily. Continue control of vascular risk factors, physical exercise, and brain stimulation exercises for brain health. They are interested in joining clinical trials through [facility], information provided. She does not drive. Follow-up in 6 months, they know to call for any changes.    Thank you for allowing me to participate in her care.  Please do not hesitate to call for any questions or concerns.   [facility], M.D.   CC: Dr. Patrcia Dolly

## 2020-06-30 NOTE — Patient Instructions (Signed)
Good to see you.  1. Start Aricept (Donepezil) 10mg : Take 1/2 tablet daily for 2 weeks, then increase to 1 tablet daily  2. There are some activities which have therapeutic value and can be useful in keeping you cognitively stimulated. You can try this website: https://www.barrowneuro.org/get-to-know-barrow/centers-programs/neurorehabilitation-center/neuro-rehab-apps-and-games/ which has options, categorized by level of difficulty.  3. To contact [facility] for ongoing clinical trials, you can contact:  [phone] (MIND)  [phone] (MIND)  [email or web address]  4. Follow-up in 6 months, call for any changes   FALL PRECAUTIONS: Be cautious when walking. Scan the area for obstacles that may increase the risk of trips and falls. When getting up in the mornings, sit up at the edge of the bed for a few minutes before getting out of bed. Consider elevating the bed at the head end to avoid drop of blood pressure when getting up. Walk always in a well-lit room (use night lights in the walls). Avoid area rugs or power cords from appliances in the middle of the walkways. Use a walker or a cane if necessary and consider physical therapy for balance exercise. Get your eyesight checked regularly.  HOME SAFETY: Consider the safety of the kitchen when operating appliances like stoves, microwave oven, and blender. Consider having supervision and share cooking responsibilities until no longer able to participate in those. Accidents with firearms and other hazards in the house should be identified and addressed as well.  ABILITY TO BE LEFT ALONE: If patient is unable to contact 911 operator, consider using LifeLine, or when the need is there, arrange for someone to stay with patients. Smoking is a fire hazard, consider supervision or cessation. Risk of wandering should be assessed by caregiver and if detected at any point, supervision and safe proof recommendations should be instituted.  MEDICATION  SUPERVISION: Inability to self-administer medication needs to be constantly addressed. Implement a mechanism to ensure safe administration of the medications.  RECOMMENDATIONS FOR ALL PATIENTS WITH MEMORY PROBLEMS: 1. Continue to exercise (Recommend 30 minutes of walking everyday, or 3 hours every week) 2. Increase social interactions - continue going to [place] and enjoy social gatherings with friends and family 3. Eat healthy, avoid fried foods and eat more fruits and vegetables 4. Maintain adequate blood pressure, blood sugar, and blood cholesterol level. Reducing the risk of stroke and cardiovascular disease also helps promoting better memory. 5. Avoid stressful situations. Live a simple life and avoid aggravations. Organize your time and prepare for the next day in anticipation. 6. Sleep well, avoid any interruptions of sleep and avoid any distractions in the bedroom that may interfere with adequate sleep quality 7. Avoid sugar, avoid sweets as there is a strong link between excessive sugar intake, diabetes, and cognitive impairment We discussed the Mediterranean diet, which has been shown to help patients reduce the risk of progressive memory disorders and reduces cardiovascular risk. This includes eating fish, eat fruits and green leafy vegetables, nuts like almonds and hazelnuts, walnuts, and also use olive oil. Avoid fast foods and fried foods as much as possible. Avoid sweets and sugar as sugar use has been linked to worsening of memory function.  There is always a concern of gradual progression of memory problems. If this is the case, then we may need to adjust level of care according to patient needs. Support, both to the patient and caregiver, should then be put into place.

## 2020-09-07 ENCOUNTER — Other Ambulatory Visit: Payer: Self-pay | Admitting: Family Medicine

## 2020-09-07 DIAGNOSIS — E559 Vitamin D deficiency, unspecified: Secondary | ICD-10-CM

## 2020-10-04 ENCOUNTER — Ambulatory Visit: Payer: BC Managed Care – PPO

## 2020-10-30 DIAGNOSIS — N390 Urinary tract infection, site not specified: Secondary | ICD-10-CM | POA: Diagnosis not present

## 2020-10-30 DIAGNOSIS — I959 Hypotension, unspecified: Secondary | ICD-10-CM | POA: Diagnosis not present

## 2020-11-07 ENCOUNTER — Ambulatory Visit (INDEPENDENT_AMBULATORY_CARE_PROVIDER_SITE_OTHER): Payer: BC Managed Care – PPO | Admitting: Family Medicine

## 2020-11-07 ENCOUNTER — Encounter: Payer: Self-pay | Admitting: Family Medicine

## 2020-11-07 ENCOUNTER — Other Ambulatory Visit: Payer: Self-pay

## 2020-11-07 VITALS — BP 110/72 | HR 67 | Temp 98.6°F | Ht 72.0 in | Wt 164.1 lb

## 2020-11-07 DIAGNOSIS — R300 Dysuria: Secondary | ICD-10-CM

## 2020-11-07 LAB — POC URINALSYSI DIPSTICK (AUTOMATED)
Bilirubin, UA: NEGATIVE
Blood, UA: POSITIVE
Glucose, UA: NEGATIVE
Ketones, UA: NEGATIVE
Leukocytes, UA: NEGATIVE
Nitrite, UA: NEGATIVE
Protein, UA: POSITIVE — AB
Spec Grav, UA: >=1.03 — AB (ref 1.010–1.025)
Urobilinogen, UA: NEGATIVE E.U./dL — AB
pH, UA: 6 (ref 5.0–8.0)

## 2020-11-07 MED ORDER — SULFAMETHOXAZOLE-TRIMETHOPRIM 800-160 MG PO TABS: 1.0000 | ORAL_TABLET | Freq: Two times a day (BID) | ORAL | 0 refills | Status: AC

## 2020-11-07 NOTE — Progress Notes (Signed)
Chief Complaint  Patient presents with  . Urinary Tract Infection  . Dysuria    Patient has completed an antibiotic     Ann Burgess is a 60 y.o. female here for possible UTI. Spouse present.   Duration: 8 days. Symptoms: Dysuria, urinary frequency, urinary hesitancy and urgency Denies: hematuria, urinary retention, fever, nausea, vomiting and flank pain, vaginal discharge Hx of recurrent UTI? No Tx'd at [facility] w Macrobid that was not fully healthy. Cx from 10/2018 showed pansensitive E coli.   Past Medical History:  Diagnosis Date  . Vitamin B12 deficiency      BP 110/72 (BP Location: Left Arm, Patient Position: Sitting, Cuff Size: Normal)   Pulse 67   Temp 98.6 F (37 C) (Oral)   Ht 6' (1.829 m)   Wt 164 lb 2 oz (74.4 kg)   SpO2 97%   BMI 22.26 kg/m  General: Awake, alert, appears stated age Heart: RRR Lungs: CTAB, normal respiratory effort, no accessory muscle usage Abd: BS+, soft, NT, ND, no masses or organomegaly MSK: No CVA tenderness, neg Lloyd's sign Psych: Nml affect and mood  Dysuria - Plan: POCT Urinalysis Dipstick (Automated), Urine Culture, sulfamethoxazole-trimethoprim (BACTRIM DS) 800-160 MG tablet  3 d of bid Bactrim. Ck culture.  Stay hydrated. Seek immediate care if pt starts to develop fevers, new/worsening symptoms, uncontrollable N/V. F/u prn. The patient and spouse voiced understanding and agreement to the plan.  Jilda Roche Rankin, DO 11/07/20 10:23 AM

## 2020-11-07 NOTE — Patient Instructions (Signed)
Stay hydrated.   Warning signs/symptoms: Uncontrollable nausea/vomiting, fevers, worsening symptoms despite treatment, confusion.  Give us around 2 business days to get culture back to you.  Let us know if you need anything. 

## 2020-11-08 LAB — URINE CULTURE
MICRO NUMBER:: 11978098
Result:: NO GROWTH
SPECIMEN QUALITY:: ADEQUATE

## 2020-11-23 ENCOUNTER — Ambulatory Visit
Admission: RE | Admit: 2020-11-23 | Discharge: 2020-11-23 | Disposition: A | Discharge status: Home / Self Care | Payer: BC Managed Care – PPO | Source: Ambulatory Visit | Attending: Family Medicine | Admitting: Family Medicine

## 2020-11-23 ENCOUNTER — Other Ambulatory Visit: Payer: Self-pay

## 2020-11-23 DIAGNOSIS — Z1231 Encounter for screening mammogram for malignant neoplasm of breast: Secondary | ICD-10-CM | POA: Diagnosis not present

## 2021-01-04 ENCOUNTER — Other Ambulatory Visit: Payer: Self-pay

## 2021-01-04 ENCOUNTER — Encounter: Payer: Self-pay | Admitting: Neurology

## 2021-01-04 ENCOUNTER — Ambulatory Visit (INDEPENDENT_AMBULATORY_CARE_PROVIDER_SITE_OTHER): Payer: BC Managed Care – PPO | Admitting: Neurology

## 2021-01-04 VITALS — BP 108/65 | HR 60 | Ht 72.0 in | Wt 161.8 lb

## 2021-01-04 DIAGNOSIS — G300 Alzheimer's disease with early onset: Secondary | ICD-10-CM | POA: Diagnosis not present

## 2021-01-04 DIAGNOSIS — F0280 Dementia in other diseases classified elsewhere without behavioral disturbance: Secondary | ICD-10-CM | POA: Diagnosis not present

## 2021-01-04 MED ORDER — DONEPEZIL HCL 10 MG PO TABS: ORAL_TABLET | ORAL | 3 refills | Status: DC

## 2021-01-04 NOTE — Patient Instructions (Signed)
Good to see you. Continue Donepezil 10mg  daily. Follow-up in 6 months, call for any changes   FALL PRECAUTIONS: Be cautious when walking. Scan the area for obstacles that may increase the risk of trips and falls. When getting up in the mornings, sit up at the edge of the bed for a few minutes before getting out of bed. Consider elevating the bed at the head end to avoid drop of blood pressure when getting up. Walk always in a well-lit room (use night lights in the walls). Avoid area rugs or power cords from appliances in the middle of the walkways. Use a walker or a cane if necessary and consider physical therapy for balance exercise. Get your eyesight checked regularly.  HOME SAFETY: Consider the safety of the kitchen when operating appliances like stoves, microwave oven, and blender. Consider having supervision and share cooking responsibilities until no longer able to participate in those. Accidents with firearms and other hazards in the house should be identified and addressed as well.  ABILITY TO BE LEFT ALONE: If patient is unable to contact 911 operator, consider using LifeLine, or when the need is there, arrange for someone to stay with patients. Smoking is a fire hazard, consider supervision or cessation. Risk of wandering should be assessed by caregiver and if detected at any point, supervision and safe proof recommendations should be instituted.   RECOMMENDATIONS FOR ALL PATIENTS WITH MEMORY PROBLEMS: 1. Continue to exercise (Recommend 30 minutes of walking everyday, or 3 hours every week) 2. Increase social interactions - continue going to [place] and enjoy social gatherings with friends and family 3. Eat healthy, avoid fried foods and eat more fruits and vegetables 4. Maintain adequate blood pressure, blood sugar, and blood cholesterol level. Reducing the risk of stroke and cardiovascular disease also helps promoting better memory. 5. Avoid stressful situations. Live a simple life and  avoid aggravations. Organize your time and prepare for the next day in anticipation. 6. Sleep well, avoid any interruptions of sleep and avoid any distractions in the bedroom that may interfere with adequate sleep quality 7. Avoid sugar, avoid sweets as there is a strong link between excessive sugar intake, diabetes, and cognitive impairment We discussed the Mediterranean diet, which has been shown to help patients reduce the risk of progressive memory disorders and reduces cardiovascular risk. This includes eating fish, eat fruits and green leafy vegetables, nuts like almonds and hazelnuts, walnuts, and also use olive oil. Avoid fast foods and fried foods as much as possible. Avoid sweets and sugar as sugar use has been linked to worsening of memory function.  There is always a concern of gradual progression of memory problems. If this is the case, then we may need to adjust level of care according to patient needs. Support, both to the patient and caregiver, should then be put into place.       Mediterranean Diet  Why follow it? Research shows. Those who follow the Mediterranean diet have a reduced risk of heart disease  The diet is associated with a reduced incidence of Parkinson's and Alzheimer's diseases People following the diet may have longer life expectancies and lower rates of chronic diseases  The Dietary Guidelines for Americans recommends the Mediterranean diet as an eating plan to promote health and prevent disease  What Is the Mediterranean Diet?  Healthy eating plan based on typical foods and recipes of Mediterranean-style cooking The diet is primarily a plant based diet; these foods should make up a majority of meals  Starches - Plant based foods should make up a majority of meals - They are an important sources of vitamins, minerals, energy, antioxidants, and fiber - Choose whole grains, foods high in fiber and minimally processed items  - Typical grain sources include  wheat, oats, barley, corn, brown rice, bulgar, farro, millet, polenta, couscous  - Various types of beans include chickpeas, lentils, fava beans, black beans, white beans   Fruits  Veggies - Large quantities of antioxidant rich fruits & veggies; 6 or more servings  - Vegetables can be eaten raw or lightly drizzled with oil and cooked  - Vegetables common to the traditional Mediterranean Diet include: artichokes, arugula, beets, broccoli, brussel sprouts, cabbage, carrots, celery, collard greens, cucumbers, eggplant, kale, leeks, lemons, lettuce, mushrooms, okra, onions, peas, peppers, potatoes, pumpkin, radishes, rutabaga, shallots, spinach, sweet potatoes, turnips, zucchini - Fruits common to the Mediterranean Diet include: apples, apricots, avocados, cherries, clementines, dates, figs, grapefruits, grapes, melons, nectarines, oranges, peaches, pears, pomegranates, strawberries, tangerines  Fats - Replace butter and margarine with healthy oils, such as olive oil, canola oil, and tahini  - Limit nuts to no more than a handful a day  - Nuts include walnuts, almonds, pecans, pistachios, pine nuts  - Limit or avoid candied, honey roasted or heavily salted nuts - Olives are central to the [employer] - can be eaten whole or used in a variety of dishes   Meats Protein - Limiting red meat: no more than a few times a month - When eating red meat: choose lean cuts and keep the portion to the size of deck of cards - Eggs: approx. 0 to 4 times a week  - Fish and lean poultry: at least 2 a week  - Healthy protein sources include, chicken, [place], lean beef, lamb - Increase intake of seafood such as tuna, salmon, trout, mackerel, shrimp, scallops - Avoid or limit high fat processed meats such as sausage and bacon  Dairy - Include moderate amounts of low fat dairy products  - Focus on healthy dairy such as fat free yogurt, skim milk, low or reduced fat cheese - Limit dairy products higher in fat such  as whole or 2% milk, cheese, ice cream  Alcohol - Moderate amounts of red wine is ok  - No more than 5 oz daily for women (all ages) and men older than age 47  - No more than 10 oz of wine daily for men younger than 3  Other - Limit sweets and other desserts  - Use herbs and spices instead of salt to flavor foods  - Herbs and spices common to the traditional Mediterranean Diet include: basil, bay leaves, chives, cloves, cumin, fennel, garlic, lavender, marjoram, mint, oregano, parsley, pepper, rosemary, sage, savory, sumac, tarragon, thyme   It's not just a diet, it's a lifestyle:  The Mediterranean diet includes lifestyle factors typical of those in the region  Foods, drinks and meals are best eaten with others and savored Daily physical activity is important for overall good health This could be strenuous exercise like running and aerobics This could also be more leisurely activities such as walking, housework, yard-work, or taking the stairs Moderation is the key; a balanced and healthy diet accommodates most foods and drinks Consider portion sizes and frequency of consumption of certain foods   Meal Ideas & Options:  Breakfast:  Whole wheat toast or whole wheat English muffins with peanut butter & hard boiled egg Steel cut oats topped with apples & cinnamon and  skim milk  Fresh fruit: banana, strawberries, melon, berries, peaches  Smoothies: strawberries, bananas, greek yogurt, peanut butter Low fat greek yogurt with blueberries and granola  Egg white omelet with spinach and mushrooms Breakfast couscous: whole wheat couscous, apricots, skim milk, cranberries  Sandwiches:  Hummus and grilled vegetables (peppers, zucchini, squash) on whole wheat bread   Grilled chicken on whole wheat pita with lettuce, tomatoes, cucumbers or tzatziki  [place] salad on whole wheat bread: tuna salad made with greek yogurt, olives, red peppers, capers, green onions Garlic rosemary lamb pita: lamb sauted  with garlic, rosemary, salt & pepper; add lettuce, cucumber, greek yogurt to pita - flavor with lemon juice and black pepper  Seafood:  Mediterranean grilled salmon, seasoned with garlic, basil, parsley, lemon juice and black pepper Shrimp, lemon, and spinach whole-grain pasta salad made with low fat greek yogurt  Seared scallops with lemon orzo  Seared tuna steaks seasoned salt, pepper, coriander topped with tomato mixture of olives, tomatoes, olive oil, minced garlic, parsley, green onions and cappers  Meats:  Herbed greek chicken salad with kalamata olives, cucumber, feta  Red bell peppers stuffed with spinach, bulgur, lean ground beef (or lentils) & topped with feta   Kebabs: skewers of chicken, tomatoes, onions, zucchini, squash  [place] burgers: made with red onions, mint, dill, lemon juice, feta cheese topped with roasted red peppers Vegetarian Cucumber salad: cucumbers, artichoke hearts, celery, red onion, feta cheese, tossed in olive oil & lemon juice  Hummus and whole grain pita points with a greek salad (lettuce, tomato, feta, olives, cucumbers, red onion) Lentil soup with celery, carrots made with vegetable broth, garlic, salt and pepper  Tabouli salad: parsley, bulgur, mint, scallions, cucumbers, tomato, radishes, lemon juice, olive oil, salt and pepper.

## 2021-01-04 NOTE — Progress Notes (Signed)
NEUROLOGY FOLLOW UP OFFICE NOTE  Ann Burgess [id number] 08/08/1960  HISTORY OF PRESENT ILLNESS: I had the pleasure of seeing Ann Burgess in follow-up in the neurology clinic on 01/04/2021.  The patient was last seen 6 months ago for early onset Alzheimer's disease. She is again accompanied by her husband who helps supplement the history today.  Records and images were personally reviewed where available.  MMSE 23/30 in 06/2020. She started Donepezil 10mg  daily which she is tolerating without side effects. Her husband manages medications, finances, meals. She does not drive and got upset about it one time. She loses things frequently, this morning they could not find the tweezers. Her husband notices that if she misses a dose of Donepezil, she is more irritable. She states she is not irritable but gets frustrated when she cannot find something. No paranoia or hallucinations. Sleep is good. She denies any headaches, dizziness, vision changes, focal numbness/tingling/weakness, no falls. They walk regularly. She has joined a longitudinal study at [facility], no medications involved.     History on Initial Assessment 02/25/2019: This is a pleasant 60 year old right-handed woman with no significant past medical history, presenting for evaluation of memory loss. She feels memory changes started 3-4 months ago, PCP notes indicate she started noticing it at the beginning of the year. 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 She has noticed word-finding difficulties where her husband would finish sentences for her. She denies getting lost driving. No missed bills. She denies misplacing things frequently or leaving the faucet running. She does not cook. She wonders about ADD, her 2 children take medication for it and her daughter thinks she has it. She does not when younger she had to work harder because  she had difficulty concentrating. She feels her mood is good, she is just frustrated with not being able to remember things, and feels it is getting worse. No clear paranoia or hallucinations. She endorses her work has been very stressful, more stressful recently. Her parents have memory changes in their 17s. She denies any history of significant head injuries. She rarely drinks alcohol.  She denies any headaches, dizziness, diplopia, dysarthria, dysphagia, neck/back pain, focal numbness/tingling/weakness, bowel/bladder dysfunction. No anosmia, tremors, no falls. Sleep is okay, when she is stressed with work, she may stay up because they "sit on my brain." She had bloodwork last month with B12 of 348, she has started B12 supplements and is not sure there is much difference.   I personally reviewed head CT without contrast done 10/2018 for headaches, no acute changes seen.  Diagnostic Data:  MRI brain without contrast done 03/2019 with no acute changes, normal exam.   Neuropsychological testing in June 2021 indicated substantial decline in memory abilities with additional low scores on measures of naming, semantic fluency, and on select measures of processing speed and executive functioning. Memory problems do have a storage problem. Findings concerning for an incipient neurodegenerative condition, most likely Alzheimer's disease, very mild early onset dementia.    PAST MEDICAL HISTORY: Past Medical History:  Diagnosis Date   Vitamin B12 deficiency     MEDICATIONS: Current Outpatient Medications on File Prior to Visit  Medication Sig Dispense Refill   donepezil (ARICEPT) 10 MG tablet Take 1/2 tablet daily for 2 weeks, then increase to 1 tablet daily 30 tablet 11   Multiple Vitamins-Minerals (CENTRUM ADULTS PO) Take  by mouth daily.     vitamin B-12 (CYANOCOBALAMIN) 1000 MCG tablet Take 1,000 mcg by mouth daily.     Vitamin D, Ergocalciferol, (DRISDOL) 1.25 MG (50000 UNIT) CAPS capsule TAKE 1  CAPSULE BY MOUTH EVERY 7 DAYS 5 capsule 1   No current facility-administered medications on file prior to visit.    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    FAMILY HISTORY: Family History  Problem Relation Age of Onset   Breast cancer Neg Hx     SOCIAL HISTORY: Social History   Socioeconomic History   Marital status: Married    Spouse name: Not on file   Number of children: 2   Years of education: Not on file   Highest education level: Not on file  Occupational History   Not on file  Tobacco Use   Smoking status: Never   Smokeless tobacco: Never  Vaping Use   Vaping Use: Never used  Substance and Sexual Activity   Alcohol use: Not Currently   Drug use: Never   Sexual activity: Yes    Partners: Male  Other Topics Concern   Not on file  Social History Narrative   Lives with husband in a two story home      Right handed      Highest level of edu- assoc.   Social Determinants of Health   Financial Resource Strain: Not on file  Food Insecurity: Not on file  Transportation Needs: Not on file  Physical Activity: Not on file  Stress: Not on file  Social Connections: Not on file  Intimate Partner Violence: Not on file     PHYSICAL EXAM: Vitals:   01/04/21 1145  BP: 108/65  Pulse: 60  SpO2: 99%   General: No acute distress Head:  Normocephalic/atraumatic Skin/Extremities: No rash, no edema Neurological Exam: alert and oriented to person, place, and time. No aphasia or dysarthria. Fund of knowledge is appropriate.  Recent and remote memory are intact, 2/3 delayed recall.  Attention and concentration are normal, 5/5 WORLD backward.  Cranial nerves: Pupils equal, round. Extraocular movements intact with no nystagmus. Visual fields full.  No facial asymmetry.  Motor: Bulk and tone normal, muscle strength 5/5 throughout with no pronator drift.   Finger to nose testing  intact.  Gait narrow-based and steady, able to tandem walk adequately.  Romberg negative.   IMPRESSION: This is a pleasant 60 yo RH woman with no significant past medical history, with early onset Alzheimer's disease. MRI brain unremarkable. She is on Donepezil 10mg  daily without side effects. She is not driving and expressed frustration with this. No significant behavioral changes. Continue control of vascular risk factors, physical exercise and brain stimulation exercises, MIND diet for overall brain health. Follow-up in 6 months, they know to call for any changes.     Thank you for allowing me to participate in her care.  Please do not hesitate to call for any questions or concerns.    , M.D.   CC: Dr. Patrcia Dolly

## 2021-01-17 ENCOUNTER — Ambulatory Visit: Payer: BC Managed Care – PPO | Admitting: Neurology

## 2021-01-21 DIAGNOSIS — B80 Enterobiasis: Secondary | ICD-10-CM | POA: Diagnosis not present

## 2021-03-09 ENCOUNTER — Telehealth: Payer: Self-pay | Admitting: Counselor

## 2021-03-09 NOTE — Telephone Encounter (Signed)
Patient husband needs [place]  to fax the test score not the report  to [employer]  they just need the scores   Attn: Reynolds Bowl [phone]

## 2021-03-15 NOTE — Telephone Encounter (Signed)
I called Ann Burgess and verified with her and her husband that they would like me to send her scores to the number below. I also verified that there is a release on file. Scores have been faxed.

## 2021-04-13 ENCOUNTER — Encounter: Payer: Self-pay | Admitting: Family Medicine

## 2021-04-13 ENCOUNTER — Other Ambulatory Visit: Payer: Self-pay

## 2021-04-13 ENCOUNTER — Ambulatory Visit (INDEPENDENT_AMBULATORY_CARE_PROVIDER_SITE_OTHER): Payer: BC Managed Care – PPO | Admitting: Family Medicine

## 2021-04-13 VITALS — BP 104/70 | HR 75 | Temp 97.5°F | Ht 72.0 in | Wt 165.0 lb

## 2021-04-13 DIAGNOSIS — Z0000 Encounter for general adult medical examination without abnormal findings: Secondary | ICD-10-CM | POA: Diagnosis not present

## 2021-04-13 DIAGNOSIS — E559 Vitamin D deficiency, unspecified: Secondary | ICD-10-CM | POA: Diagnosis not present

## 2021-04-13 DIAGNOSIS — R4189 Other symptoms and signs involving cognitive functions and awareness: Secondary | ICD-10-CM | POA: Insufficient documentation

## 2021-04-13 DIAGNOSIS — R413 Other amnesia: Secondary | ICD-10-CM | POA: Insufficient documentation

## 2021-04-13 DIAGNOSIS — E538 Deficiency of other specified B group vitamins: Secondary | ICD-10-CM | POA: Insufficient documentation

## 2021-04-13 HISTORY — DX: Deficiency of other specified B group vitamins: E53.8

## 2021-04-13 HISTORY — DX: Vitamin D deficiency, unspecified: E55.9

## 2021-04-13 LAB — COMPREHENSIVE METABOLIC PANEL
ALT: 11 U/L (ref 0–35)
AST: 17 U/L (ref 0–37)
Albumin: 4.4 g/dL (ref 3.5–5.2)
Alkaline Phosphatase: 46 U/L (ref 39–117)
BUN: 18 mg/dL (ref 6–23)
CO2: 33 mEq/L — ABNORMAL HIGH (ref 19–32)
Calcium: 9.7 mg/dL (ref 8.4–10.5)
Chloride: 102 mEq/L (ref 96–112)
Creatinine, Ser: 0.84 mg/dL (ref 0.40–1.20)
GFR: 76.48 mL/min (ref >60.00)
Glucose, Bld: 90 mg/dL (ref 70–99)
Potassium: 4.2 mEq/L (ref 3.5–5.1)
Sodium: 140 mEq/L (ref 135–145)
Total Bilirubin: 0.6 mg/dL (ref 0.2–1.2)
Total Protein: 6.6 g/dL (ref 6.0–8.3)

## 2021-04-13 LAB — URINALYSIS, ROUTINE W REFLEX MICROSCOPIC
Bilirubin Urine: NEGATIVE
Ketones, ur: NEGATIVE
Nitrite: NEGATIVE
Specific Gravity, Urine: >=1.03 — AB (ref 1.000–1.030)
Total Protein, Urine: NEGATIVE
Urine Glucose: NEGATIVE
Urobilinogen, UA: 0.2 (ref 0.0–1.0)
pH: 5.5 (ref 5.0–8.0)

## 2021-04-13 LAB — CBC
HCT: 34.4 % — ABNORMAL LOW (ref 36.0–46.0)
Hemoglobin: 11.8 g/dL — ABNORMAL LOW (ref 12.0–15.0)
MCHC: 34.2 g/dL (ref 30.0–36.0)
MCV: 89.2 fl (ref 78.0–100.0)
Platelets: 198 10*3/uL (ref 150.0–400.0)
RBC: 3.86 Mil/uL — ABNORMAL LOW (ref 3.87–5.11)
RDW: 13 % (ref 11.5–15.5)
WBC: 3.9 10*3/uL — ABNORMAL LOW (ref 4.0–10.5)

## 2021-04-13 LAB — LDL CHOLESTEROL, DIRECT: Direct LDL: 133 mg/dL

## 2021-04-13 LAB — VITAMIN D 25 HYDROXY (VIT D DEFICIENCY, FRACTURES): VITD: 37.21 ng/mL (ref 30.00–100.00)

## 2021-04-13 LAB — VITAMIN B12: Vitamin B-12: 1220 pg/mL — ABNORMAL HIGH (ref 211–911)

## 2021-04-13 NOTE — Progress Notes (Addendum)
Established Patient Office Visit  Subjective:  Patient ID: Ann Burgess, female    DOB: [birth year]  Age: 60 y.o. MRN: [id number]  CC:  Chief Complaint  Patient presents with   Transitions Of Care    TOC from Dr. Loletha Grayer, no concerns. Would like refill on meds.     HPI Ann Burgess presents for her yearly health check and follow-up of vitamin D and B12 deficiency.  She is accompanied by her husband.  This unfortunate individual is in early age cognitive decline.  MRI of her brain was normal 2 years ago.  Recently involved in a dementia study at [facility].  They would like to study her brain after her passing.  Ongoing follow-up with neurology.  Currently treated with Aricept.  She is disabled from work.  She has spectator's with a rescue dogs.  She lives alone at home with her husband.  Up-to-date on her health maintenance.  Exercises by working in the yard and walking her 4 dogs on a regular basis.  Nonfasting today.  Past Medical History:  Diagnosis Date   Vitamin B12 deficiency     Past Surgical History:  Procedure Laterality Date   TUBAL LIGATION      Family History  Problem Relation Age of Onset   Breast cancer Neg Hx     Social History   Socioeconomic History   Marital status: Married    Spouse name: Not on file   Number of children: 2   Years of education: Not on file   Highest education level: Not on file  Occupational History   Not on file  Tobacco Use   Smoking status: Never   Smokeless tobacco: Never  Vaping Use   Vaping Use: Never used  Substance and Sexual Activity   Alcohol use: Not Currently   Drug use: Never   Sexual activity: Yes    Partners: Male  Other Topics Concern   Not on file  Social History Narrative   Lives with husband in a two story home      Right handed      Highest level of edu- assoc.   Social Determinants of Health   Financial Resource Strain: Not on file  Food Insecurity: Not on file  Transportation Needs: Not  on file  Physical Activity: Not on file  Stress: Not on file  Social Connections: Not on file  Intimate Partner Violence: Not on file    Outpatient Medications Prior to Visit  Medication Sig Dispense Refill   donepezil (ARICEPT) 10 MG tablet Take 1 tablet daily 90 tablet 3   Multiple Vitamins-Minerals (CENTRUM ADULTS PO) Take by mouth daily.     vitamin B-12 (CYANOCOBALAMIN) 1000 MCG tablet Take 1,000 mcg by mouth daily.     Vitamin D, Ergocalciferol, (DRISDOL) 1.25 MG (50000 UNIT) CAPS capsule TAKE 1 CAPSULE BY MOUTH EVERY 7 DAYS 5 capsule 1   No facility-administered medications prior to visit.    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  HENT: Negative.    Eyes:  Negative for photophobia and visual disturbance.  Respiratory: Negative.    Cardiovascular: Negative.   Gastrointestinal: Negative.  Negative for abdominal pain, blood in stool and constipation.  Endocrine: Negative for polyphagia and polyuria.  Genitourinary:  Negative for difficulty urinating, frequency and urgency.  Musculoskeletal:  Negative for gait problem and joint swelling.  Neurological:  Negative for speech difficulty and weakness.  Psychiatric/Behavioral:  Negative for agitation and behavioral problems.      Objective:    Physical Exam Vitals reviewed.  Constitutional:      General: She is not in acute distress.    Appearance: Normal appearance. She is normal weight. She is not ill-appearing, toxic-appearing or diaphoretic.  HENT:     Head: Normocephalic and atraumatic.     Right Ear: Tympanic membrane, ear canal and external ear normal.     Left Ear: Tympanic membrane, ear canal and external ear normal.     Mouth/Throat:     Mouth: Mucous membranes are moist.     Pharynx: Oropharynx is clear. No  oropharyngeal exudate or posterior oropharyngeal erythema.  Eyes:     General: No scleral icterus.       Right eye: No discharge.        Left eye: No discharge.     Extraocular Movements: Extraocular movements intact.     Conjunctiva/sclera: Conjunctivae normal.     Pupils: Pupils are equal, round, and reactive to light.  Neck:     Vascular: No carotid bruit.  Cardiovascular:     Rate and Rhythm: Normal rate and regular rhythm.  Pulmonary:     Effort: Pulmonary effort is normal.     Breath sounds: Normal breath sounds.  Abdominal:     General: Bowel sounds are normal.  Musculoskeletal:     Cervical back: No rigidity or tenderness.  Lymphadenopathy:     Cervical: No cervical adenopathy.  Skin:    General: Skin is warm and dry.  Neurological:     Mental Status: She is alert and oriented to person, place, and time.  Psychiatric:        Mood and Affect: Mood normal.        Behavior: Behavior normal.    BP 104/70 (BP Location: Right Arm, Patient Position: Sitting, Cuff Size: Normal)   Pulse 75   Temp (!) 97.5 F (36.4 C) (Temporal)   Ht 6' (1.829 m)   Wt 165 lb (74.8 kg)   SpO2 97%   BMI 22.38 kg/m  Wt Readings from Last 3 Encounters:  04/13/21 165 lb (74.8 kg)  01/04/21 161 lb 12.8 oz (73.4 kg)  11/07/20 164 lb 2 oz (74.4 kg)     Health Maintenance Due  Topic Date Due   HIV Screening  Never done   Hepatitis C Screening  Never done   Zoster Vaccines- Shingrix (1 of 2) Never done    There are no preventive care reminders to display for this patient.  Lab Results  Component Value Date   TSH 1.290 01/29/2019   Lab Results  Component Value Date   WBC 3.9 (L) 04/13/2021   HGB 11.8 (L) 04/13/2021   HCT 34.4 (L) 04/13/2021   MCV 89.2 04/13/2021   PLT 198.0 04/13/2021   Lab Results  Component Value Date   NA 140 04/13/2021   K 4.2 04/13/2021   CO2 33 (H) 04/13/2021   GLUCOSE 90 04/13/2021   BUN 18 04/13/2021   CREATININE 0.84 04/13/2021   BILITOT 0.6  04/13/2021   ALKPHOS 46 04/13/2021   AST 17 04/13/2021   ALT 11 04/13/2021   PROT 6.6 04/13/2021   ALBUMIN 4.4 04/13/2021   CALCIUM 9.7 04/13/2021   ANIONGAP 7 10/05/2018   GFR 76.48 04/13/2021   Lab Results  Component Value Date  CHOL 234 (H) 04/05/2020   Lab Results  Component Value Date   HDL 81.60 04/05/2020   Lab Results  Component Value Date   LDLCALC 142 (H) 04/05/2020   Lab Results  Component Value Date   TRIG 52.0 04/05/2020   Lab Results  Component Value Date   CHOLHDL 3 04/05/2020   No results found for: HGBA1C    Assessment & Plan:   Problem List Items Addressed This Visit       Other   Cognitive decline   Annual physical exam - Primary   Relevant Orders   CBC (Completed)   Comprehensive metabolic panel (Completed)   LDL cholesterol, direct (Completed)   Urinalysis, Routine w reflex microscopic (Completed)   DG Bone Density   Vitamin D deficiency   Relevant Medications   Vitamin D, Ergocalciferol, (DRISDOL) 1.25 MG (50000 UNIT) CAPS capsule   Other Relevant Orders   VITAMIN D 25 Hydroxy (Vit-D Deficiency, Fractures) (Completed)   B12 deficiency   Relevant Orders   Vitamin B12 (Completed)    Meds ordered this encounter  Medications   Vitamin D, Ergocalciferol, (DRISDOL) 1.25 MG (50000 UNIT) CAPS capsule    Sig: Take 1 capsule (50,000 Units total) by mouth every 7 (seven) days.    Dispense:  5 capsule    Refill:  1     Follow-up: Return in about 6 months (around 10/11/2021).   Continue current medicines.  We will refill vitamin D prescription as needed.  Information was given on health maintenance and disease prevention.  No flu vaccination today secondary to history Guillain Barre.  Exercise and of course continue follow-up with neurology.  Mliss Sax, MD

## 2021-04-16 MED ORDER — VITAMIN D (ERGOCALCIFEROL) 1.25 MG (50000 UNIT) PO CAPS
50000.0000 [IU] | ORAL_CAPSULE | ORAL | 1 refills | Status: DC
Start: 2021-04-16 — End: 2022-08-19

## 2021-04-16 NOTE — Addendum Note (Signed)
Addended by: Andrez Grime on: 04/16/2021 08:55 AM   Modules accepted: Orders

## 2021-04-17 ENCOUNTER — Telehealth (HOSPITAL_BASED_OUTPATIENT_CLINIC_OR_DEPARTMENT_OTHER): Payer: Self-pay

## 2021-04-19 ENCOUNTER — Other Ambulatory Visit (HOSPITAL_BASED_OUTPATIENT_CLINIC_OR_DEPARTMENT_OTHER): Payer: Self-pay | Admitting: Family Medicine

## 2021-04-19 DIAGNOSIS — Z0000 Encounter for general adult medical examination without abnormal findings: Secondary | ICD-10-CM

## 2021-04-30 ENCOUNTER — Other Ambulatory Visit: Payer: Self-pay

## 2021-04-30 ENCOUNTER — Ambulatory Visit (HOSPITAL_BASED_OUTPATIENT_CLINIC_OR_DEPARTMENT_OTHER)
Admission: RE | Admit: 2021-04-30 | Discharge: 2021-04-30 | Disposition: A | Discharge status: Home / Self Care | Payer: BC Managed Care – PPO | Source: Ambulatory Visit | Attending: Family Medicine | Admitting: Family Medicine

## 2021-04-30 DIAGNOSIS — Z780 Asymptomatic menopausal state: Secondary | ICD-10-CM | POA: Diagnosis not present

## 2021-04-30 DIAGNOSIS — M8589 Other specified disorders of bone density and structure, multiple sites: Secondary | ICD-10-CM | POA: Diagnosis not present

## 2021-04-30 DIAGNOSIS — Z0000 Encounter for general adult medical examination without abnormal findings: Secondary | ICD-10-CM | POA: Diagnosis not present

## 2021-05-01 NOTE — Progress Notes (Signed)
Please set up a virtual to discuss her bone density amatory results.

## 2021-05-14 ENCOUNTER — Encounter: Payer: Self-pay | Admitting: Family Medicine

## 2021-05-14 ENCOUNTER — Telehealth (INDEPENDENT_AMBULATORY_CARE_PROVIDER_SITE_OTHER): Payer: BC Managed Care – PPO | Admitting: Family Medicine

## 2021-05-14 DIAGNOSIS — M85859 Other specified disorders of bone density and structure, unspecified thigh: Secondary | ICD-10-CM | POA: Insufficient documentation

## 2021-05-14 DIAGNOSIS — R4189 Other symptoms and signs involving cognitive functions and awareness: Secondary | ICD-10-CM | POA: Diagnosis not present

## 2021-05-14 DIAGNOSIS — E559 Vitamin D deficiency, unspecified: Secondary | ICD-10-CM

## 2021-05-14 HISTORY — DX: Other specified disorders of bone density and structure, unspecified thigh: M85.859

## 2021-05-14 NOTE — Progress Notes (Addendum)
Established Patient Office Visit  Subjective:  Patient ID: Ann Burgess, female    DOB: 10-09-1960  Age: 60 y.o. MRN: [id number]  CC:  Chief Complaint  Patient presents with   Advice Only    Discuss bone density     HPI Ann Burgess presents for follow-up of her recent bone scan that showed osteopenia of the spine with a T score of -1.7 and osteopenia of the femoral neck with a T score of -1.4.  Patient has recently started weekly high-dose vitamin D for vitamin D deficiency.  She is not supplementing with calcium at this time.  She is in cognitive decline and her husband is present for the video conference.  Meeting is to discuss treatment options.  Past Medical History:  Diagnosis Date   Vitamin B12 deficiency     Past Surgical History:  Procedure Laterality Date   TUBAL LIGATION      Family History  Problem Relation Age of Onset   Breast cancer Neg Hx     Social History   Socioeconomic History   Marital status: Married    Spouse name: Not on file   Number of children: 2   Years of education: Not on file   Highest education level: Not on file  Occupational History   Not on file  Tobacco Use   Smoking status: Never   Smokeless tobacco: Never  Vaping Use   Vaping Use: Never used  Substance and Sexual Activity   Alcohol use: Not Currently   Drug use: Never   Sexual activity: Yes    Partners: Male  Other Topics Concern   Not on file  Social History Narrative   Lives with husband in a two story home      Right handed      Highest level of edu- assoc.   Social Determinants of Health   Financial Resource Strain: Not on file  Food Insecurity: Not on file  Transportation Needs: Not on file  Physical Activity: Not on file  Stress: Not on file  Social Connections: Not on file  Intimate Partner Violence: Not on file    Outpatient Medications Prior to Visit  Medication Sig Dispense Refill   donepezil (ARICEPT) 10 MG tablet Take 1 tablet daily 90 tablet  3   Multiple Vitamins-Minerals (CENTRUM ADULTS PO) Take by mouth daily.     vitamin B-12 (CYANOCOBALAMIN) 1000 MCG tablet Take 1,000 mcg by mouth daily.     Vitamin D, Ergocalciferol, (DRISDOL) 1.25 MG (50000 UNIT) CAPS capsule Take 1 capsule (50,000 Units total) by mouth every 7 (seven) days. 5 capsule 1   No facility-administered medications prior to visit.    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   HENT: Negative.    Respiratory: Negative.    Cardiovascular: Negative.   Gastrointestinal: Negative.   Musculoskeletal:  Negative for arthralgias.  Psychiatric/Behavioral:  Positive for confusion.      Objective:    Physical Exam Vitals and nursing note reviewed.  Constitutional:      Appearance: Normal appearance. She is normal weight.  Pulmonary:     Effort: Pulmonary effort is normal.  Skin:    General: Skin is warm and dry.  Neurological:     Mental Status: She is alert. Mental status is at baseline.  Psychiatric:        Mood and Affect:  Mood normal.        Behavior: Behavior normal.    There were no vitals taken for this visit. Wt Readings from Last 3 Encounters:  04/13/21 165 lb (74.8 kg)  01/04/21 161 lb 12.8 oz (73.4 kg)  11/07/20 164 lb 2 oz (74.4 kg)     Health Maintenance Due  Topic Date Due   HIV Screening  Never done   Hepatitis C Screening  Never done   Zoster Vaccines- Shingrix (1 of 2) Never done    There are no preventive care reminders to display for this patient.  Lab Results  Component Value Date   TSH 1.290 01/29/2019   Lab Results  Component Value Date   WBC 3.9 (L) 04/13/2021   HGB 11.8 (L) 04/13/2021   HCT 34.4 (L) 04/13/2021   MCV 89.2 04/13/2021   PLT 198.0 04/13/2021   Lab Results  Component Value Date   NA 140 04/13/2021   K 4.2 04/13/2021   CO2 33 (H) 04/13/2021   GLUCOSE 90 04/13/2021    BUN 18 04/13/2021   CREATININE 0.84 04/13/2021   BILITOT 0.6 04/13/2021   ALKPHOS 46 04/13/2021   AST 17 04/13/2021   ALT 11 04/13/2021   PROT 6.6 04/13/2021   ALBUMIN 4.4 04/13/2021   CALCIUM 9.7 04/13/2021   ANIONGAP 7 10/05/2018   GFR 76.48 04/13/2021   Lab Results  Component Value Date   CHOL 234 (H) 04/05/2020   Lab Results  Component Value Date   HDL 81.60 04/05/2020   Lab Results  Component Value Date   LDLCALC 142 (H) 04/05/2020   Lab Results  Component Value Date   TRIG 52.0 04/05/2020   Lab Results  Component Value Date   CHOLHDL 3 04/05/2020   No results found for: HGBA1C    Assessment & Plan:   Problem List Items Addressed This Visit       Musculoskeletal and Integument   Osteopenia of neck of femur   Relevant Orders   Ambulatory referral to Endocrinology     Other   Cognitive decline   Vitamin D deficiency - Primary    No orders of the defined types were placed in this encounter.   Follow-up: Return Follow-up as previously directed..   Recommend that they continue high-dose weekly vitamin D with 1200 mg of calcium daily.  We discussed adding a bisphosphonate.  Discussed its rare side effect of osteonecrosis of the teeth.  They would need to weigh the benefit of taking the medicine and preventing an osteoporotic fracture versus a rare side effect.  They will consider it and let me know.  They are interested in a second opinion. Mliss Sax, MD  Virtual Visit via Video Note  I connected with Ann Burgess on 06/11/21 at  4:15 PM EST by a video enabled telemedicine application and verified that I am speaking with the correct person using two identifiers.  Location: Patient: home with her husband taking part in the encounter.  Provider: work   I discussed the limitations of evaluation and management by telemedicine and the availability of in person appointments. The patient expressed understanding and agreed to proceed.  History  of Present Illness:    Observations/Objective:   Assessment and Plan:   Follow Up Instructions:    I discussed the assessment and treatment plan with the patient. The patient was provided an opportunity to ask questions and all were answered. The patient agreed with the plan and demonstrated an understanding of the  instructions.   The patient was advised to call back or seek an in-person evaluation if the symptoms worsen or if the condition fails to improve as anticipated.  I provided 25 minutes of non-face-to-face time during this encounter.   Mliss Sax, MD

## 2021-07-09 ENCOUNTER — Ambulatory Visit (INDEPENDENT_AMBULATORY_CARE_PROVIDER_SITE_OTHER): Payer: BC Managed Care – PPO | Admitting: Physician Assistant

## 2021-07-09 ENCOUNTER — Other Ambulatory Visit: Payer: Self-pay

## 2021-07-09 ENCOUNTER — Encounter: Payer: Self-pay | Admitting: Physician Assistant

## 2021-07-09 VITALS — BP 91/56 | HR 86 | Resp 18 | Ht 72.0 in | Wt 163.0 lb

## 2021-07-09 DIAGNOSIS — F0280 Dementia in other diseases classified elsewhere without behavioral disturbance: Secondary | ICD-10-CM | POA: Diagnosis not present

## 2021-07-09 DIAGNOSIS — G300 Alzheimer's disease with early onset: Secondary | ICD-10-CM

## 2021-07-09 NOTE — Patient Instructions (Signed)
Good to see you. Continue Donepezil 10mg  daily. Follow-up in 6 months, call for any changes   FALL PRECAUTIONS: Be cautious when walking. Scan the area for obstacles that may increase the risk of trips and falls. When getting up in the mornings, sit up at the edge of the bed for a few minutes before getting out of bed. Consider elevating the bed at the head end to avoid drop of blood pressure when getting up. Walk always in a well-lit room (use night lights in the walls). Avoid area rugs or power cords from appliances in the middle of the walkways. Use a walker or a cane if necessary and consider physical therapy for balance exercise. Get your eyesight checked regularly.  HOME SAFETY: Consider the safety of the kitchen when operating appliances like stoves, microwave oven, and blender. Consider having supervision and share cooking responsibilities until no longer able to participate in those. Accidents with firearms and other hazards in the house should be identified and addressed as well.  ABILITY TO BE LEFT ALONE: If patient is unable to contact 911 operator, consider using LifeLine, or when the need is there, arrange for someone to stay with patients. Smoking is a fire hazard, consider supervision or cessation. Risk of wandering should be assessed by caregiver and if detected at any point, supervision and safe proof recommendations should be instituted.   RECOMMENDATIONS FOR ALL PATIENTS WITH MEMORY PROBLEMS: 1. Continue to exercise (Recommend 30 minutes of walking everyday, or 3 hours every week) 2. Increase social interactions - continue going to [place] and enjoy social gatherings with friends and family 3. Eat healthy, avoid fried foods and eat more fruits and vegetables 4. Maintain adequate blood pressure, blood sugar, and blood cholesterol level. Reducing the risk of stroke and cardiovascular disease also helps promoting better memory. 5. Avoid stressful situations. Live a simple life and  avoid aggravations. Organize your time and prepare for the next day in anticipation. 6. Sleep well, avoid any interruptions of sleep and avoid any distractions in the bedroom that may interfere with adequate sleep quality 7. Avoid sugar, avoid sweets as there is a strong link between excessive sugar intake, diabetes, and cognitive impairment We discussed the Mediterranean diet, which has been shown to help patients reduce the risk of progressive memory disorders and reduces cardiovascular risk. This includes eating fish, eat fruits and green leafy vegetables, nuts like almonds and hazelnuts, walnuts, and also use olive oil. Avoid fast foods and fried foods as much as possible. Avoid sweets and sugar as sugar use has been linked to worsening of memory function.  There is always a concern of gradual progression of memory problems. If this is the case, then we may need to adjust level of care according to patient needs. Support, both to the patient and caregiver, should then be put into place.       Mediterranean Diet  Why follow it? Research shows Those who follow the Mediterranean diet have a reduced risk of heart disease  The diet is associated with a reduced incidence of Parkinson's and Alzheimer's diseases People following the diet may have longer life expectancies and lower rates of chronic diseases  The Dietary Guidelines for Americans recommends the Mediterranean diet as an eating plan to promote health and prevent disease  What Is the Mediterranean Diet?  Healthy eating plan based on typical foods and recipes of Mediterranean-style cooking The diet is primarily a plant based diet; these foods should make up a majority of meals  Starches - Plant based foods should make up a majority of meals - They are an important sources of vitamins, minerals, energy, antioxidants, and fiber - Choose whole grains, foods high in fiber and minimally processed items  - Typical grain sources include  wheat, oats, barley, corn, brown rice, bulgar, farro, millet, polenta, couscous  - Various types of beans include chickpeas, lentils, fava beans, black beans, white beans   Fruits  Veggies - Large quantities of antioxidant rich fruits & veggies; 6 or more servings  - Vegetables can be eaten raw or lightly drizzled with oil and cooked  - Vegetables common to the traditional Mediterranean Diet include: artichokes, arugula, beets, broccoli, brussel sprouts, cabbage, carrots, celery, collard greens, cucumbers, eggplant, kale, leeks, lemons, lettuce, mushrooms, okra, onions, peas, peppers, potatoes, pumpkin, radishes, rutabaga, shallots, spinach, sweet potatoes, turnips, zucchini - Fruits common to the Mediterranean Diet include: apples, apricots, avocados, cherries, clementines, dates, figs, grapefruits, grapes, melons, nectarines, oranges, peaches, pears, pomegranates, strawberries, tangerines  Fats - Replace butter and margarine with healthy oils, such as olive oil, canola oil, and tahini  - Limit nuts to no more than a handful a day  - Nuts include walnuts, almonds, pecans, pistachios, pine nuts  - Limit or avoid candied, honey roasted or heavily salted nuts - Olives are central to the [employer] - can be eaten whole or used in a variety of dishes   Meats Protein - Limiting red meat: no more than a few times a month - When eating red meat: choose lean cuts and keep the portion to the size of deck of cards - Eggs: approx. 0 to 4 times a week  - Fish and lean poultry: at least 2 a week  - Healthy protein sources include, chicken, [place], lean beef, lamb - Increase intake of seafood such as tuna, salmon, trout, mackerel, shrimp, scallops - Avoid or limit high fat processed meats such as sausage and bacon  Dairy - Include moderate amounts of low fat dairy products  - Focus on healthy dairy such as fat free yogurt, skim milk, low or reduced fat cheese - Limit dairy products higher in fat such  as whole or 2% milk, cheese, ice cream  Alcohol - Moderate amounts of red wine is ok  - No more than 5 oz daily for women (all ages) and men older than age 34  - No more than 10 oz of wine daily for men younger than 64  Other - Limit sweets and other desserts  - Use herbs and spices instead of salt to flavor foods  - Herbs and spices common to the traditional Mediterranean Diet include: basil, bay leaves, chives, cloves, cumin, fennel, garlic, lavender, marjoram, mint, oregano, parsley, pepper, rosemary, sage, savory, sumac, tarragon, thyme   Its not just a diet, its a lifestyle:  The Mediterranean diet includes lifestyle factors typical of those in the region  Foods, drinks and meals are best eaten with others and savored Daily physical activity is important for overall good health This could be strenuous exercise like running and aerobics This could also be more leisurely activities such as walking, housework, yard-work, or taking the stairs Moderation is the key; a balanced and healthy diet accommodates most foods and drinks Consider portion sizes and frequency of consumption of certain foods   Meal Ideas & Options:  Breakfast:  Whole wheat toast or whole wheat English muffins with peanut butter & hard boiled egg Steel cut oats topped with apples & cinnamon and  skim milk  Fresh fruit: banana, strawberries, melon, berries, peaches  Smoothies: strawberries, bananas, greek yogurt, peanut butter Low fat greek yogurt with blueberries and granola  Egg white omelet with spinach and mushrooms Breakfast couscous: whole wheat couscous, apricots, skim milk, cranberries  Sandwiches:  Hummus and grilled vegetables (peppers, zucchini, squash) on whole wheat bread   Grilled chicken on whole wheat pita with lettuce, tomatoes, cucumbers or tzatziki  Tuna salad on whole wheat bread: tuna salad made with greek yogurt, olives, red peppers, capers, green onions Garlic rosemary lamb pita: lamb sauted  with garlic, rosemary, salt & pepper; add lettuce, cucumber, greek yogurt to pita - flavor with lemon juice and black pepper  Seafood:  Mediterranean grilled salmon, seasoned with garlic, basil, parsley, lemon juice and black pepper Shrimp, lemon, and spinach whole-grain pasta salad made with low fat greek yogurt  Seared scallops with lemon orzo  Seared tuna steaks seasoned salt, pepper, coriander topped with tomato mixture of olives, tomatoes, olive oil, minced garlic, parsley, green onions and cappers  Meats:  Herbed greek chicken salad with kalamata olives, cucumber, feta  Red bell peppers stuffed with spinach, bulgur, lean ground beef (or lentils) & topped with feta   Kebabs: skewers of chicken, tomatoes, onions, zucchini, squash  Turkey burgers: made with red onions, mint, dill, lemon juice, feta cheese topped with roasted red peppers Vegetarian Cucumber salad: cucumbers, artichoke hearts, celery, red onion, feta cheese, tossed in olive oil & lemon juice  Hummus and whole grain pita points with a greek salad (lettuce, tomato, feta, olives, cucumbers, red onion) Lentil soup with celery, carrots made with vegetable broth, garlic, salt and pepper  Tabouli salad: parsley, bulgur, mint, scallions, cucumbers, tomato, radishes, lemon juice, olive oil, salt and pepper.  

## 2021-07-09 NOTE — Progress Notes (Signed)
Assessment/Plan:   61 year old woman with a diagnosis of early onset Alzheimer's disease, currently on donepezil 10 mg daily without side effects.  Patient is part of a longitudinal study at [facility], no notes are available for review at this time.  Today's MMSE 16/30.   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  Discussed the importance of regular daily schedule with inclusion of crossword puzzles to maintain brain function.  Continue to monitor mood by PCP Stay active at least 30 minutes at least 3 times a week.  Naps should be scheduled and should be no longer than 60 minutes and should not occur after 2 PM.  Mediterranean diet is recommended  Control cardiovascular risk factors  Continue donepezil 10 mg daily Side effects were discussed Follow up in 6  months.   Case discussed with Dr. Karel Jarvis who agrees with the plan   This is a pleasant 61 yo RH woman with no significant past medical history, with early onset Alzheimer's disease. MRI brain unremarkable. She is on Donepezil 10mg  daily without side effects. She is not driving and expressed frustration with this. No significant behavioral changes. Continue control of vascular risk factors, physical exercise and brain stimulation exercises, MIND diet for overall brain health. Follow-up in 6 months, they know to call for any changes.     Subjective:    Ja Carpentier is a very pleasant 61 y.o. RH female with no significant past medical history, with early onset Alzheimer's disease.  MRI unremarkable.  She is on donepezil 10 mg daily without side effects.  Seen today in follow up for memory loss. This patient is accompanied in the office by her husband who supplements the history.  Previous records as well as any outside records available were reviewed prior to todays visit.  Patient was last seen at our office on 01/04/21.  Since  that time, the patient has been evaluated that [facility], as part of a longitudinal study, notes are not available for review at this time.  Per husband's report, "they came up with the same diagnosis ". She reports that her short-term memory may be about the same, with difficulty trying to remember full sentences or recalling words.  Occasionally she may repeat herself.  She denies confusion when entering the room, but occasionally loses an object, does not recall where she placed him.  She does not drive, which brings significant frustration to her.  Her mood is stable, occasionally she feels depressed about her diagnosis, as her friends do not come to visit her anymore and she does not participate in activities outside of the house.  She states that the weather also depresses her, she is looking forward to the spring where she can plant flowers.  She likes to spend significant time in Facebook, reading about me seeing people and see if she can help.  She sleeps "fine ".  She denies any vivid dreams or sleepwalking.  "I do not really dream anymore ".  She denies hallucinations or paranoia, there are no hygiene concerns.  She is independent of bathing and dressing.  Her husband manages the medications and finances.  Appetite is good, denies trouble swallowing.  She does not cook.  Ambulates without difficulty, denies any falls or head injuries.  She denies  headaches, double vision, dizziness, focal numbness or tingling, unilateral weakness, tremors or anosmia. No history of seizures.  Denies urine incontinence, retention, constipation or diarrhea.      History on Initial Assessment 02/25/2019: This is a pleasant 61 year old right-handed woman with no significant past medical history, presenting for evaluation of memory loss. She feels memory changes started 3-4 months ago, PCP notes indicate she started noticing it at the beginning of the year. 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 She has noticed word-finding difficulties where her husband would finish sentences for her. She denies getting lost driving. No missed bills. She denies misplacing things frequently or leaving the faucet running. She does not cook. She wonders about ADD, her 2 children take medication for it and her daughter thinks she has it. She does not when younger she had to work harder because she had difficulty concentrating. She feels her mood is good, she is just frustrated with not being able to remember things, and feels it is getting worse. No clear paranoia or hallucinations. She endorses her work has been very stressful, more stressful recently. Her parents have memory changes in their 18s. She denies any history of significant head injuries. She rarely drinks alcohol.   She denies any headaches, dizziness, diplopia, dysarthria, dysphagia, neck/back pain, focal numbness/tingling/weakness, bowel/bladder dysfunction. No anosmia, tremors, no falls. Sleep is okay, when she is stressed with work, she may stay up because they "sit on my brain." She had bloodwork last month with B12 of 348, she has started B12 supplements and is not sure there is much difference.    I personally reviewed head CT without contrast done 10/2018 for headaches, no acute changes seen.   Diagnostic Data:  MRI brain without contrast done 03/2019 with no acute changes, normal exam.    Neuropsychological testing in June 2021 indicated substantial decline in memory abilities with additional low scores on measures of naming, semantic fluency, and on select measures of processing speed and executive functioning. Memory problems do have a storage problem. Findings concerning for an incipient neurodegenerative condition, most likely Alzheimer's disease, very mild early onset dementia.      PREVIOUS MEDICATIONS:   CURRENT  MEDICATIONS:  Outpatient Encounter Medications as of 07/09/2021  Medication Sig   donepezil (ARICEPT) 10 MG tablet Take 1 tablet daily   Multiple Vitamins-Minerals (CENTRUM ADULTS PO) Take by mouth daily.   vitamin B-12 (CYANOCOBALAMIN) 1000 MCG tablet Take 1,000 mcg by mouth daily.   Vitamin D, Ergocalciferol, (DRISDOL) 1.25 MG (50000 UNIT) CAPS capsule Take 1 capsule (50,000 Units total) by mouth every 7 (seven) days.   No facility-administered encounter medications on file as of 07/09/2021.     Objective:     PHYSICAL EXAMINATION:    VITALS:   Vitals:   07/09/21 1048  BP: (!) 91/56  Pulse: 86  Resp: 18  SpO2: 97%  Weight: 163 lb (73.9 kg)  Height: 6' (1.829 m)    GEN:  The patient appears stated age and is in NAD. HEENT:  Normocephalic, atraumatic.   Neurological examination:  General: NAD, well-groomed, appears stated age. Orientation: The patient is alert. Oriented to person, place and has difficulty with date  cranial nerves: There is good facial symmetry.The speech is fluent and clear. No aphasia or dysarthria. Fund of knowledge is appropriate. Recent and remote memory are impaired. Attention and concentration are reduced.  Able to name objects and repeat phrases.  Hearing is intact to conversational tone.  Sensation: Sensation is intact to light touch throughout Motor: Strength is at least antigravity x4. Tremors: none  DTR's 2/4 in UE/LE     Montreal Cognitive Assessment  11/11/2019 02/25/2019  Visuospatial/ Executive (0/5) 2 4  Naming (0/3) 3 3  Attention: Read list of digits (0/2) 2 2  Attention: Read list of letters (0/1) 1 1  Attention: Serial 7 subtraction starting at 100 (0/3) 2 2  Language: Repeat phrase (0/2) 1 2  Language : Fluency (0/1) 1 0  Abstraction (0/2) 1 2  Delayed Recall (0/5) 0 0  Orientation (0/6) 6 6  Total 19 22  Adjusted Score (based on education) 19 -   MMSE - Mini Mental State Exam 06/30/2020  Orientation to time 2  Orientation to  Place 5  Registration 3  Attention/ Calculation 5  Recall 0  Language- name 2 objects 2  Language- repeat 1  Language- follow 3 step command 3  Language- read & follow direction 1  Write a sentence 1  Copy design 0  Total score 23    No flowsheet data found.     Movement examination: Tone: There is normal tone in the UE/LE Abnormal movements:  no tremor.  No myoclonus.  No asterixis.   Coordination:  There is no decremation with RAM's. Normal finger to nose  Gait and Station: The patient has no difficulty arising out of a deep-seated chair without the use of the hands. The patient's stride length is good.  Gait is cautious and narrow.        Total time spent on today's visit was minutes, including both face-to-face time and nonface-to-face time. Time included that spent on review of records (prior notes available to me/labs/imaging if pertinent), discussing treatment and goals, answering patient's questions and coordinating care.  Cc:  Mliss Sax, MD Marlowe Kays, PA-C

## 2021-07-18 ENCOUNTER — Telehealth: Payer: Self-pay | Admitting: Family Medicine

## 2021-07-18 NOTE — Telephone Encounter (Signed)
Pts husband dropped off forms to be filled out. Placed in Dr Evangeline Gula folder up front.

## 2021-07-19 NOTE — Telephone Encounter (Signed)
Forms received waiting to be signed by provider.

## 2021-07-23 ENCOUNTER — Encounter: Payer: Self-pay | Admitting: Internal Medicine

## 2021-07-23 ENCOUNTER — Other Ambulatory Visit (INDEPENDENT_AMBULATORY_CARE_PROVIDER_SITE_OTHER): Payer: BC Managed Care – PPO

## 2021-07-23 ENCOUNTER — Ambulatory Visit (INDEPENDENT_AMBULATORY_CARE_PROVIDER_SITE_OTHER): Payer: BC Managed Care – PPO | Admitting: Internal Medicine

## 2021-07-23 ENCOUNTER — Other Ambulatory Visit: Payer: Self-pay

## 2021-07-23 VITALS — BP 104/68 | HR 65 | Ht 72.0 in | Wt 167.4 lb

## 2021-07-23 DIAGNOSIS — M859 Disorder of bone density and structure, unspecified: Secondary | ICD-10-CM

## 2021-07-23 LAB — MAGNESIUM: Magnesium: 2.1 mg/dL (ref 1.5–2.5)

## 2021-07-23 LAB — COMPREHENSIVE METABOLIC PANEL
ALT: 16 U/L (ref 0–35)
AST: 20 U/L (ref 0–37)
Albumin: 4.7 g/dL (ref 3.5–5.2)
Alkaline Phosphatase: 50 U/L (ref 39–117)
BUN: 18 mg/dL (ref 6–23)
CO2: 37 mEq/L — ABNORMAL HIGH (ref 19–32)
Calcium: 9.8 mg/dL (ref 8.4–10.5)
Chloride: 101 mEq/L (ref 96–112)
Creatinine, Ser: 0.97 mg/dL (ref 0.40–1.20)
GFR: 64.23 mL/min (ref >60.00)
Glucose, Bld: 110 mg/dL — ABNORMAL HIGH (ref 70–99)
Potassium: 4 mEq/L (ref 3.5–5.1)
Sodium: 140 mEq/L (ref 135–145)
Total Bilirubin: 0.3 mg/dL (ref 0.2–1.2)
Total Protein: 7.4 g/dL (ref 6.0–8.3)

## 2021-07-23 LAB — VITAMIN D 25 HYDROXY (VIT D DEFICIENCY, FRACTURES): VITD: 47.79 ng/mL (ref 30.00–100.00)

## 2021-07-23 LAB — TSH: TSH: 1.26 u[IU]/mL (ref 0.35–5.50)

## 2021-07-23 LAB — PHOSPHORUS: Phosphorus: 2.9 mg/dL (ref 2.3–4.6)

## 2021-07-23 NOTE — Progress Notes (Signed)
Name: Ann Burgess  MRN/ DOB: [id number], 04-04-1961    Age/ Sex: 61 y.o., female    PCP: Libby Maw, MD   Reason for Endocrinology Evaluation: Low bone density      Date of Initial Endocrinology Evaluation: 07/23/2021     HPI: Ms. Thalya Wozny is a 61 y.o. female with a past medical history of dementia . The patient presented for initial endocrinology clinic visit on 07/23/2021 for consultative assistance with her Low bone density .    Patient is accompanied by spouse today  Pt was diagnosed with Low bone density : 04/2021 with T-score -1.7 at the [facility]   Menarche at age : 45 Menopausal at age :  early 72's  Fracture Hx: no Hx of HRT: no FH of osteoporosis or hip fracture: no Prior Hx of anti-estrogenic therapy :no  Prior Hx of anti-resorptive therapy : no    She is on Calcium / Vit D 600-400  mg BID   MVI  Denies heartburn  Denies joint pains      HISTORY:  Past Medical History:  Past Medical History:  Diagnosis Date   Vitamin B12 deficiency    Past Surgical History:  Past Surgical History:  Procedure Laterality Date   TUBAL LIGATION      Social History:  reports that she has never smoked. She has never used smokeless tobacco. She reports that she does not currently use alcohol. She reports that she does not use drugs. Family History: family history is not on file.   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 If you have any questions, ask your nurse or doctor.          Calcium 600 600 MG Tabs tablet Generic drug: calcium carbonate Take 600 mg by mouth 2 (two) times daily with a meal.   CENTRUM ADULTS PO Take by mouth daily.   donepezil 10 MG tablet Commonly known as: ARICEPT Take 1 tablet daily   vitamin B-12 1000 MCG tablet Commonly known  as: CYANOCOBALAMIN Take 1,000 mcg by mouth daily.   Vitamin D (Ergocalciferol) 1.25 MG (50000 UNIT) Caps capsule Commonly known as: DRISDOL Take 1 capsule (50,000 Units total) by mouth every 7 (seven) days.          REVIEW OF SYSTEMS: A comprehensive ROS was conducted with the patient and is negative except as per HPI    OBJECTIVE:  VS: BP 104/68 (BP Location: Left Arm, Patient Position: Sitting, Cuff Size: Small)    Pulse 65    Ht 6' (1.829 m)    Wt 167 lb 6.4 oz (75.9 kg)    SpO2 96%    BMI 22.70 kg/m    Wt Readings from Last 3 Encounters:  07/23/21 167 lb 6.4 oz (75.9 kg)  07/09/21 163 lb (73.9 kg)  04/13/21 165 lb (74.8 kg)     EXAM: General: Pt appears well and is in NAD  Neck: General: Supple without adenopathy. Thyroid: Thyroid size normal.  No goiter or nodules appreciated.   Lungs: Clear with good BS bilat with no rales, rhonchi, or wheezes  Heart: Auscultation: RRR.  Abdomen: Normoactive bowel sounds, soft, nontender, without masses or organomegaly palpable  Extremities:  BL LE: No  pretibial edema normal ROM and strength.  Skin: Hair: Texture and amount normal with gender appropriate distribution Skin Inspection: No rashes Skin Palpation: Skin temperature, texture, and thickness normal to palpation  Mental Status: Judgment, insight: Intact Orientation: Oriented to time, place, and person Mood and affect: No depression, anxiety, or agitation     DATA REVIEWED:  Latest Reference Range & Units 07/23/21 15:58  Sodium 135 - 145 mEq/L 140  Potassium 3.5 - 5.1 mEq/L 4.0  Chloride 96 - 112 mEq/L 101  CO2 19 - 32 mEq/L 37 (H)  Glucose 70 - 99 mg/dL 110 (H)  BUN 6 - 23 mg/dL 18  Creatinine 0.40 - 1.20 mg/dL 0.97  Calcium 8.4 - 10.5 mg/dL 9.8  Phosphorus 2.3 - 4.6 mg/dL 2.9  Magnesium 1.5 - 2.5 mg/dL 2.1  Alkaline Phosphatase 39 - 117 U/L 50  Albumin 3.5 - 5.2 g/dL 4.7  AST 0 - 37 U/L 20  ALT 0 - 35 U/L 16  Total Protein 6.0 - 8.3 g/dL 7.4  Total  Bilirubin 0.2 - 1.2 mg/dL 0.3  GFR >60.00 mL/min 64.23    Latest Reference Range & Units 07/23/21 15:58  VITD 30.00 - 100.00 ng/mL 47.79    Latest Reference Range & Units 07/23/21 15:58  PTH, Intact 16 - 77 pg/mL 21  TSH 0.35 - 5.50 uIU/mL 1.26      DXA 04/30/2021  ASSESSMENT: The BMD measured at [facility] L1-L4 is 0.975 g/cm2 with a T-score of -1.7. This patient is considered osteopenic according to [facility]) criteria. The scan quality is good.   Site Region Measured Date Measured Age WHO YA BMD Classification T-score [facility] L1-L4 04/30/2021 58.6 Low Bone Mass -1.7 0.975 g/cm2   DualFemur Neck Left 6/30/2021 58.6 years Low Bone Mass -1.4 0.844 g/cm2   ASSESSMENT/PLAN/RECOMMENDATIONS:   Low Bone density :  -Patient has been offered bisphosphonate therapy through her primary care physician but they would like to hold off on this for now. -Historically she has not been on any calcium or vitamin D, but recently started optimal supplementation of calcium and vitamin D. -We discussed importance of weightbearing exercises - Today's labs show normal Vitamin D, PTH, CMP ,awaiting on 24-hour urinary calcium -Patient would like to focus on lifestyle changes and I have recommended repeat bone density in 2 years.  We discussed the importance of having repeat bone density at the same location in the future for proper comparison of BMD  Follow-up as needed  Signed electronically by: Mack Guise, MD  [facility] Endocrinology  [facility] [address] [place], [place] [address] Phone: [phone] FAX: [phone]   CC: Libby Maw, [place] [address] Phone: [phone] Fax: [phone]   Return to Endocrinology clinic as below: Future Appointments  Date Time Provider [place]  07/23/2021  3:20 PM Marchele Decock, Melanie Crazier, MD [facility] None  01/07/2022  9:00 AM  Rondel Jumbo, PA-C [facility] None

## 2021-07-23 NOTE — Patient Instructions (Signed)
Continue Calcium 1200 mg daily  Try to work on Weight bearing exercises to help with bone health      24-Hour Urine Collection  You will be collecting your urine for a 24-hour period of time. Your timer starts with your first urine of the morning (For example - If you first pee at 9AM, your timer will start at 9AM) Throw away your first urine of the morning Collect your urine every time you pee for the next 24 hours STOP your urine collection 24 hours after you started the collection (For example - You would stop at 9AM the day after you started)

## 2021-07-24 ENCOUNTER — Encounter: Payer: Self-pay | Admitting: Internal Medicine

## 2021-07-24 LAB — PARATHYROID HORMONE, INTACT (NO CA): PTH: 21 pg/mL (ref 16–77)

## 2021-07-25 ENCOUNTER — Other Ambulatory Visit: Payer: BC Managed Care – PPO

## 2021-07-25 DIAGNOSIS — M859 Disorder of bone density and structure, unspecified: Secondary | ICD-10-CM | POA: Diagnosis not present

## 2021-07-25 NOTE — Telephone Encounter (Signed)
Pt requesting call back at [phone]

## 2021-07-26 LAB — CREATININE, URINE, 24 HOUR: Creatinine, 24H Ur: 0.51 g/(24.h) (ref 0.50–2.15)

## 2021-07-26 LAB — CALCIUM, URINE, 24 HOUR: Calcium, 24H Urine: 22 mg/24 h — ABNORMAL LOW

## 2021-07-27 NOTE — Telephone Encounter (Signed)
Pt checking on status of this message. Please give a call back [phone].

## 2021-07-27 NOTE — Telephone Encounter (Signed)
Patient husband calling regarding forms if they will be filled out before next week. Advised that forms will be filled out as soon as Provider is able to get to them but we will call when they are completed.

## 2021-07-30 NOTE — Telephone Encounter (Signed)
Pt's husband checking on status of this message. It looks like have been completed, he wants to come in tomorrow 07/31/21 to pick up these papers.

## 2021-07-31 NOTE — Telephone Encounter (Signed)
Forms completed and faxed also copied for husband to come pick up.

## 2021-09-13 ENCOUNTER — Other Ambulatory Visit: Payer: Self-pay | Admitting: Family Medicine

## 2021-09-13 DIAGNOSIS — E559 Vitamin D deficiency, unspecified: Secondary | ICD-10-CM

## 2021-11-05 ENCOUNTER — Telehealth: Payer: Self-pay | Admitting: Family Medicine

## 2021-11-05 NOTE — Telephone Encounter (Signed)
Pt husband called and stated he left a form to be filled out and I checked the file but I didn't see anything. From [employer] work leave

## 2021-11-05 NOTE — Telephone Encounter (Signed)
Forms were faxed last week patient and husband aware per patient he would like a copy of forms. Will make copies and have up front to pick up.

## 2021-11-13 ENCOUNTER — Other Ambulatory Visit: Payer: Self-pay | Admitting: Family Medicine

## 2021-11-13 DIAGNOSIS — Z1231 Encounter for screening mammogram for malignant neoplasm of breast: Secondary | ICD-10-CM

## 2021-11-23 DIAGNOSIS — M222X2 Patellofemoral disorders, left knee: Secondary | ICD-10-CM | POA: Insufficient documentation

## 2021-11-23 DIAGNOSIS — M7122 Synovial cyst of popliteal space [Baker], left knee: Secondary | ICD-10-CM | POA: Diagnosis not present

## 2021-11-23 DIAGNOSIS — M25562 Pain in left knee: Secondary | ICD-10-CM | POA: Diagnosis not present

## 2021-11-23 HISTORY — DX: Patellofemoral disorders, left knee: M22.2X2

## 2021-11-26 DIAGNOSIS — M7122 Synovial cyst of popliteal space [Baker], left knee: Secondary | ICD-10-CM | POA: Diagnosis not present

## 2021-11-27 ENCOUNTER — Ambulatory Visit
Admission: RE | Admit: 2021-11-27 | Discharge: 2021-11-27 | Disposition: A | Discharge status: Home / Self Care | Payer: BC Managed Care – PPO | Source: Ambulatory Visit | Attending: Family Medicine | Admitting: Family Medicine

## 2021-11-27 DIAGNOSIS — Z1231 Encounter for screening mammogram for malignant neoplasm of breast: Secondary | ICD-10-CM

## 2022-01-07 ENCOUNTER — Ambulatory Visit (INDEPENDENT_AMBULATORY_CARE_PROVIDER_SITE_OTHER): Payer: BC Managed Care – PPO | Admitting: Physician Assistant

## 2022-01-07 ENCOUNTER — Encounter: Payer: Self-pay | Admitting: Physician Assistant

## 2022-01-07 VITALS — BP 117/69 | HR 83 | Resp 20 | Wt 160.0 lb

## 2022-01-07 DIAGNOSIS — G300 Alzheimer's disease with early onset: Secondary | ICD-10-CM

## 2022-01-07 DIAGNOSIS — F0280 Dementia in other diseases classified elsewhere without behavioral disturbance: Secondary | ICD-10-CM | POA: Diagnosis not present

## 2022-01-07 NOTE — Patient Instructions (Signed)
Good to see you. Continue Donepezil 10mg  daily. Follow-up in 6 months.   FALL PRECAUTIONS: Be cautious when walking. Scan the area for obstacles that may increase the risk of trips and falls. When getting up in the mornings, sit up at the edge of the bed for a few minutes before getting out of bed. Consider elevating the bed at the head end to avoid drop of blood pressure when getting up. Walk always in a well-lit room (use night lights in the walls). Avoid area rugs or power cords from appliances in the middle of the walkways. Use a walker or a cane if necessary and consider physical therapy for balance exercise. Get your eyesight checked regularly.  HOME SAFETY: Consider the safety of the kitchen when operating appliances like stoves, microwave oven, and blender. Consider having supervision and share cooking responsibilities until no longer able to participate in those. Accidents with firearms and other hazards in the house should be identified and addressed as well.  ABILITY TO BE LEFT ALONE: If patient is unable to contact 911 operator, consider using LifeLine, or when the need is there, arrange for someone to stay with patients. Smoking is a fire hazard, consider supervision or cessation. Risk of wandering should be assessed by caregiver and if detected at any point, supervision and safe proof recommendations should be instituted.   RECOMMENDATIONS FOR ALL PATIENTS WITH MEMORY PROBLEMS: 1. Continue to exercise (Recommend 30 minutes of walking everyday, or 3 hours every week) 2. Increase social interactions - continue going to [place] and enjoy social gatherings with friends and family 3. Eat healthy, avoid fried foods and eat more fruits and vegetables 4. Maintain adequate blood pressure, blood sugar, and blood cholesterol level. Reducing the risk of stroke and cardiovascular disease also helps promoting better memory. 5. Avoid stressful situations. Live a simple life and avoid aggravations.  Organize your time and prepare for the next day in anticipation. 6. Sleep well, avoid any interruptions of sleep and avoid any distractions in the bedroom that may interfere with adequate sleep quality 7. Avoid sugar, avoid sweets as there is a strong link between excessive sugar intake, diabetes, and cognitive impairment We discussed the Mediterranean diet, which has been shown to help patients reduce the risk of progressive memory disorders and reduces cardiovascular risk. This includes eating fish, eat fruits and green leafy vegetables, nuts like almonds and hazelnuts, walnuts, and also use olive oil. Avoid fast foods and fried foods as much as possible. Avoid sweets and sugar as sugar use has been linked to worsening of memory function.  There is always a concern of gradual progression of memory problems. If this is the case, then we may need to adjust level of care according to patient needs. Support, both to the patient and caregiver, should then be put into place.       Mediterranean Diet  Why follow it? Research shows. Those who follow the Mediterranean diet have a reduced risk of heart disease  The diet is associated with a reduced incidence of Parkinson's and Alzheimer's diseases People following the diet may have longer life expectancies and lower rates of chronic diseases  The Dietary Guidelines for Americans recommends the Mediterranean diet as an eating plan to promote health and prevent disease  What Is the Mediterranean Diet?  Healthy eating plan based on typical foods and recipes of Mediterranean-style cooking The diet is primarily a plant based diet; these foods should make up a majority of meals   Starches - Plant  based foods should make up a majority of meals - They are an important sources of vitamins, minerals, energy, antioxidants, and fiber - Choose whole grains, foods high in fiber and minimally processed items  - Typical grain sources include wheat, oats, barley,  corn, brown rice, bulgar, farro, millet, polenta, couscous  - Various types of beans include chickpeas, lentils, fava beans, black beans, white beans   Fruits  Veggies - Large quantities of antioxidant rich fruits & veggies; 6 or more servings  - Vegetables can be eaten raw or lightly drizzled with oil and cooked  - Vegetables common to the traditional Mediterranean Diet include: artichokes, arugula, beets, broccoli, brussel sprouts, cabbage, carrots, celery, collard greens, cucumbers, eggplant, kale, leeks, lemons, lettuce, mushrooms, okra, onions, peas, peppers, potatoes, pumpkin, radishes, rutabaga, shallots, spinach, sweet potatoes, turnips, zucchini - Fruits common to the Mediterranean Diet include: apples, apricots, avocados, cherries, clementines, dates, figs, grapefruits, grapes, melons, nectarines, oranges, peaches, pears, pomegranates, strawberries, tangerines  Fats - Replace butter and margarine with healthy oils, such as olive oil, canola oil, and tahini  - Limit nuts to no more than a handful a day  - Nuts include walnuts, almonds, pecans, pistachios, pine nuts  - Limit or avoid candied, honey roasted or heavily salted nuts - Olives are central to the [employer] - can be eaten whole or used in a variety of dishes   Meats Protein - Limiting red meat: no more than a few times a month - When eating red meat: choose lean cuts and keep the portion to the size of deck of cards - Eggs: approx. 0 to 4 times a week  - Fish and lean poultry: at least 2 a week  - Healthy protein sources include, chicken, [place], lean beef, lamb - Increase intake of seafood such as tuna, salmon, trout, mackerel, shrimp, scallops - Avoid or limit high fat processed meats such as sausage and bacon  Dairy - Include moderate amounts of low fat dairy products  - Focus on healthy dairy such as fat free yogurt, skim milk, low or reduced fat cheese - Limit dairy products higher in fat such as whole or 2% milk,  cheese, ice cream  Alcohol - Moderate amounts of red wine is ok  - No more than 5 oz daily for women (all ages) and men older than age 31  - No more than 10 oz of wine daily for men younger than 76  Other - Limit sweets and other desserts  - Use herbs and spices instead of salt to flavor foods  - Herbs and spices common to the traditional Mediterranean Diet include: basil, bay leaves, chives, cloves, cumin, fennel, garlic, lavender, marjoram, mint, oregano, parsley, pepper, rosemary, sage, savory, sumac, tarragon, thyme   It's not just a diet, it's a lifestyle:  The Mediterranean diet includes lifestyle factors typical of those in the region  Foods, drinks and meals are best eaten with others and savored Daily physical activity is important for overall good health This could be strenuous exercise like running and aerobics This could also be more leisurely activities such as walking, housework, yard-work, or taking the stairs Moderation is the key; a balanced and healthy diet accommodates most foods and drinks Consider portion sizes and frequency of consumption of certain foods   Meal Ideas & Options:  Breakfast:  Whole wheat toast or whole wheat English muffins with peanut butter & hard boiled egg Steel cut oats topped with apples & cinnamon and skim milk  Fresh fruit: banana, strawberries, melon, berries, peaches  Smoothies: strawberries, bananas, greek yogurt, peanut butter Low fat greek yogurt with blueberries and granola  Egg white omelet with spinach and mushrooms Breakfast couscous: whole wheat couscous, apricots, skim milk, cranberries  Sandwiches:  Hummus and grilled vegetables (peppers, zucchini, squash) on whole wheat bread   Grilled chicken on whole wheat pita with lettuce, tomatoes, cucumbers or tzatziki  [place] salad on whole wheat bread: tuna salad made with greek yogurt, olives, red peppers, capers, green onions Garlic rosemary lamb pita: lamb sauted with garlic, rosemary,  salt & pepper; add lettuce, cucumber, greek yogurt to pita - flavor with lemon juice and black pepper  Seafood:  Mediterranean grilled salmon, seasoned with garlic, basil, parsley, lemon juice and black pepper Shrimp, lemon, and spinach whole-grain pasta salad made with low fat greek yogurt  Seared scallops with lemon orzo  Seared tuna steaks seasoned salt, pepper, coriander topped with tomato mixture of olives, tomatoes, olive oil, minced garlic, parsley, green onions and cappers  Meats:  Herbed greek chicken salad with kalamata olives, cucumber, feta  Red bell peppers stuffed with spinach, bulgur, lean ground beef (or lentils) & topped with feta   Kebabs: skewers of chicken, tomatoes, onions, zucchini, squash  [place] burgers: made with red onions, mint, dill, lemon juice, feta cheese topped with roasted red peppers Vegetarian Cucumber salad: cucumbers, artichoke hearts, celery, red onion, feta cheese, tossed in olive oil & lemon juice  Hummus and whole grain pita points with a greek salad (lettuce, tomato, feta, olives, cucumbers, red onion) Lentil soup with celery, carrots made with vegetable broth, garlic, salt and pepper  Tabouli salad: parsley, bulgur, mint, scallions, cucumbers, tomato, radishes, lemon juice, olive oil, salt and pepper.

## 2022-01-07 NOTE — Progress Notes (Signed)
Assessment/Plan:   Dementia likely due to  Ann Burgess is a very pleasant 61 y.o. RH female with a history of early onset Alzheimer's disease seen today in follow up for memory loss. Patient is currently on donepezil 10 mg daily without side effects  MRI of the brain was essentially unremarkable.  Last MMSE was 16/30 (February 2023).   Recommendations:    Continue donepezil 10 mg daily. side effects were discussed Follow up in  6 months.   Case discussed with Dr. Karel Jarvis who agrees with the plan     Subjective:    This patient is accompanied in the office by her husband who supplements the history.  Previous records as well as any outside records available were reviewed prior to todays visit.  She was last seen in office on 07/10/2021 at which time MMSE was 16/30.   Any changes in memory since last visit?  She continues to have difficulty with remembering long sentences or recalling words but is not worse than prior.  She spends her day with her husband,  walking the dogs, reading extensively, watching short videos  Patient lives with: Husband repeats oneself?  Endorsed, "occasionally" Disoriented when walking into a room?  Patient denies   Leaving objects in unusual places?  Patient denies   Ambulates  with difficulty?   Patient denies   Recent falls?  Patient denies   Any head injuries?  Patient denies   History of seizures?   Patient denies   Wandering behavior?  Patient denies   Patient drives?   Patient no longer drives  Any mood changes since last visit?  Patient denies   Any worsening depression?:  Patient denies   Hallucinations?  Patient denies   Paranoia?  Patient denies   Patient reports that sleeps well without vivid dreams, REM behavior or sleepwalking. Once in a while I have insomnia.    History of sleep apnea?  Patient denies   Any hygiene concerns?  Patient denies   Independent of bathing and dressing?  Endorsed  Does the patient needs help with  medications?  Denies Who is in charge of the finances?  Husband is in charge    Any changes in appetite?  Patient denies   Patient have trouble swallowing? Patient denies   Does the patient cook?  Patient denies   Any kitchen accidents such as leaving the stove on? Patient denies   Any headaches?  Patient denies   Double vision? Patient denies   Any focal numbness or tingling?  Patient denies   Chronic back pain Patient denies   Unilateral weakness?  Patient denies   Any tremors?  Patient denies   Any history of anosmia?  Patient denies   Any incontinence of urine?  Patient denies   Any bowel dysfunction?   Patient denies           History on Initial Assessment 02/25/2019: This is a pleasant 61 year old right-handed woman with no significant past medical history, presenting for evaluation of memory loss. She feels memory changes started 3-4 months ago, PCP notes indicate she started noticing it at the beginning of the year. 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 She has noticed word-finding difficulties where her husband would finish sentences for her. She denies getting lost driving. No missed bills. She denies misplacing things frequently or leaving the faucet  running. She does not cook. She wonders about ADD, her 2 children take medication for it and her daughter thinks she has it. She does not when younger she had to work harder because she had difficulty concentrating. She feels her mood is good, she is just frustrated with not being able to remember things, and feels it is getting worse. No clear paranoia or hallucinations. She endorses her work has been very stressful, more stressful recently. Her parents have memory changes in their 65s. She denies any history of significant head injuries. She rarely drinks alcohol.   She denies any headaches, dizziness, diplopia, dysarthria,  dysphagia, neck/back pain, focal numbness/tingling/weakness, bowel/bladder dysfunction. No anosmia, tremors, no falls. Sleep is okay, when she is stressed with work, she may stay up because they "sit on my brain." She had bloodwork last month with B12 of 348, she has started B12 supplements and is not sure there is much difference.    I personally reviewed head CT without contrast done 10/2018 for headaches, no acute changes seen.   Diagnostic Data:  MRI brain without contrast done 03/2019 with no acute changes, normal exam.    Neuropsychological testing in June 2021 indicated substantial decline in memory abilities with additional low scores on measures of naming, semantic fluency, and on select measures of processing speed and executive functioning. Memory problems do have a storage problem. Findings concerning for an incipient neurodegenerative condition, most likely Alzheimer's disease, very mild early onset dementia.      PREVIOUS MEDICATIONS:   CURRENT MEDICATIONS:  Outpatient Encounter Medications as of 01/07/2022  Medication Sig   calcium carbonate (CALCIUM 600) 600 MG TABS tablet Take 600 mg by mouth 2 (two) times daily with a meal.   donepezil (ARICEPT) 10 MG tablet Take 1 tablet daily   Multiple Vitamins-Minerals (CENTRUM ADULTS PO) Take by mouth daily.   vitamin B-12 (CYANOCOBALAMIN) 1000 MCG tablet Take 1,000 mcg by mouth daily.   Vitamin D, Ergocalciferol, (DRISDOL) 1.25 MG (50000 UNIT) CAPS capsule Take 1 capsule (50,000 Units total) by mouth every 7 (seven) days.   No facility-administered encounter medications on file as of 01/07/2022.       07/10/2021    8:00 AM 06/30/2020    8:00 AM  MMSE - Mini Mental State Exam  Orientation to time 0 2  Orientation to Place 5 5  Registration 3 3  Attention/ Calculation 0 5  Recall 0 0  Language- name 2 objects 2 2  Language- repeat 1 1  Language- follow 3 step command 3 3  Language- read & follow direction 1 1  Write a sentence 1 1   Copy design 0 0  Total score 16 23      11/11/2019    9:00 AM 02/25/2019   10:00 AM  Montreal Cognitive Assessment   Visuospatial/ Executive (0/5) 2 4  Naming (0/3) 3 3  Attention: Read list of digits (0/2) 2 2  Attention: Read list of letters (0/1) 1 1  Attention: Serial 7 subtraction starting at 100 (0/3) 2 2  Language: Repeat phrase (0/2) 1 2  Language : Fluency (0/1) 1 0  Abstraction (0/2) 1 2  Delayed Recall (0/5) 0 0  Orientation (0/6) 6 6  Total 19 22  Adjusted Score (based on education) 19     Objective:     PHYSICAL EXAMINATION:    VITALS:   Vitals:   01/07/22 0904  BP: 117/69  Pulse: 83  Resp: 20  SpO2: 98%  Weight: 160  lb (72.6 kg)    GEN:  The patient appears stated age and is in NAD. HEENT:  Normocephalic, atraumatic.   Neurological examination:  General: NAD, well-groomed, appears stated age. Orientation: The patient is alert. Oriented to person, place and date Cranial nerves: There is good facial symmetry.The speech is fluent and clear. No aphasia or dysarthria. Fund of knowledge is appropriate. Recent and remote memory are impaired. Attention and concentration are reduced.  Able to name objects and repeat phrases.  Hearing is intact to conversational tone.    Sensation: Sensation is intact to light touch throughout Motor: Strength is at least antigravity x4. Tremors: none  DTR's 2/4 in UE/LE     Movement examination: Tone: There is normal tone in the UE/LE Abnormal movements:  no tremor.  No myoclonus.  No asterixis.   Coordination:  There is no decremation with RAM's. Normal finger to nose  Gait and Station: The patient has no difficulty arising out of a deep-seated chair without the use of the hands. The patient's stride length is good.  Gait is cautious and narrow.    Thank you for allowing [place] the opportunity to participate in the care of this nice patient. Please do not hesitate to contact us for any questions or concerns.   Total time  spent on today's visit was 30 minutes dedicated to this patient today, preparing to see patient, examining the patient, ordering tests and/or medications and counseling the patient, documenting clinical information in the EHR or other health record, independently interpreting results and communicating results to the patient/family, discussing treatment and goals, answering patient's questions and coordinating care.  Cc:  Mliss Sax, MD  Marlowe Kays 01/07/2022 11:54 AM

## 2022-03-23 ENCOUNTER — Other Ambulatory Visit: Payer: Self-pay | Admitting: Neurology

## 2022-03-25 ENCOUNTER — Other Ambulatory Visit: Payer: Self-pay

## 2022-03-25 MED ORDER — DONEPEZIL HCL 10 MG PO TABS: ORAL_TABLET | ORAL | 1 refills | Status: DC

## 2022-04-01 ENCOUNTER — Telehealth: Payer: Self-pay | Admitting: Physician Assistant

## 2022-04-01 ENCOUNTER — Telehealth: Payer: Self-pay

## 2022-04-01 ENCOUNTER — Other Ambulatory Visit: Payer: Self-pay

## 2022-04-01 MED ORDER — DONEPEZIL HCL 10 MG PO TABS: ORAL_TABLET | ORAL | 1 refills | Status: DC

## 2022-04-01 MED ORDER — DONEPEZIL HCL 10 MG PO TABS: 10.0000 mg | ORAL_TABLET | Freq: Every day | ORAL | 3 refills | Status: AC

## 2022-04-01 NOTE — Telephone Encounter (Signed)
Rx sent for Donepezil 10mg  daily

## 2022-04-01 NOTE — Telephone Encounter (Signed)
Called [facility] and cancelled donepezil and made new script to [facility]

## 2022-04-01 NOTE — Telephone Encounter (Signed)
1. Which medications need refilled? (List name and dosage, if known) donepezil, 10 mg  2. Which pharmacy/location is medication to be sent to? (include street and city if local pharmacy) [facility] in [place]  3. Do they need a 30 day or 90 day supply? 90  Switching pharmacies, out of refills at previous pharmacy.

## 2022-04-01 NOTE — Telephone Encounter (Signed)
Thank you :)

## 2022-04-01 NOTE — Telephone Encounter (Signed)
Called and informed med is at [facility]

## 2022-04-01 NOTE — Telephone Encounter (Signed)
Pt's husband called in and left a message. He was returning Renee's call

## 2022-06-26 ENCOUNTER — Ambulatory Visit: Payer: Medicare Other | Admitting: Physician Assistant

## 2022-06-26 ENCOUNTER — Encounter: Payer: Self-pay | Admitting: Physician Assistant

## 2022-06-26 VITALS — BP 100/56 | HR 98 | Resp 18 | Ht 72.0 in | Wt 158.0 lb

## 2022-06-26 DIAGNOSIS — F0280 Dementia in other diseases classified elsewhere without behavioral disturbance: Secondary | ICD-10-CM

## 2022-06-26 DIAGNOSIS — R413 Other amnesia: Secondary | ICD-10-CM | POA: Diagnosis not present

## 2022-06-26 DIAGNOSIS — G300 Alzheimer's disease with early onset: Secondary | ICD-10-CM

## 2022-06-26 NOTE — Patient Instructions (Signed)
Good to see you. Continue Donepezil 10mg  daily.  Neuropsych testing for clarity of diagnosis and disease trajectory   Follow-up in 6 months.   FALL PRECAUTIONS: Be cautious when walking. Scan the area for obstacles that may increase the risk of trips and falls. When getting up in the mornings, sit up at the edge of the bed for a few minutes before getting out of bed. Consider elevating the bed at the head end to avoid drop of blood pressure when getting up. Walk always in a well-lit room (use night lights in the walls). Avoid area rugs or power cords from appliances in the middle of the walkways. Use a walker or a cane if necessary and consider physical therapy for balance exercise. Get your eyesight checked regularly.  HOME SAFETY: Consider the safety of the kitchen when operating appliances like stoves, microwave oven, and blender. Consider having supervision and share cooking responsibilities until no longer able to participate in those. Accidents with firearms and other hazards in the house should be identified and addressed as well.  ABILITY TO BE LEFT ALONE: If patient is unable to contact 911 operator, consider using LifeLine, or when the need is there, arrange for someone to stay with patients. Smoking is a fire hazard, consider supervision or cessation. Risk of wandering should be assessed by caregiver and if detected at any point, supervision and safe proof recommendations should be instituted.   RECOMMENDATIONS FOR ALL PATIENTS WITH MEMORY PROBLEMS: 1. Continue to exercise (Recommend 30 minutes of walking everyday, or 3 hours every week) 2. Increase social interactions - continue going to [place] and enjoy social gatherings with friends and family 3. Eat healthy, avoid fried foods and eat more fruits and vegetables 4. Maintain adequate blood pressure, blood sugar, and blood cholesterol level. Reducing the risk of stroke and cardiovascular disease also helps promoting better memory. 5.  Avoid stressful situations. Live a simple life and avoid aggravations. Organize your time and prepare for the next day in anticipation. 6. Sleep well, avoid any interruptions of sleep and avoid any distractions in the bedroom that may interfere with adequate sleep quality 7. Avoid sugar, avoid sweets as there is a strong link between excessive sugar intake, diabetes, and cognitive impairment We discussed the Mediterranean diet, which has been shown to help patients reduce the risk of progressive memory disorders and reduces cardiovascular risk. This includes eating fish, eat fruits and green leafy vegetables, nuts like almonds and hazelnuts, walnuts, and also use olive oil. Avoid fast foods and fried foods as much as possible. Avoid sweets and sugar as sugar use has been linked to worsening of memory function.  There is always a concern of gradual progression of memory problems. If this is the case, then we may need to adjust level of care according to patient needs. Support, both to the patient and caregiver, should then be put into place.       Mediterranean Diet  Why follow it? Research shows. Those who follow the Mediterranean diet have a reduced risk of heart disease  The diet is associated with a reduced incidence of Parkinson's and Alzheimer's diseases People following the diet may have longer life expectancies and lower rates of chronic diseases  The Dietary Guidelines for Americans recommends the Mediterranean diet as an eating plan to promote health and prevent disease  What Is the Mediterranean Diet?  Healthy eating plan based on typical foods and recipes of Mediterranean-style cooking The diet is primarily a plant based diet; these foods  should make up a majority of meals   Starches - Plant based foods should make up a majority of meals - They are an important sources of vitamins, minerals, energy, antioxidants, and fiber - Choose whole grains, foods high in fiber and minimally  processed items  - Typical grain sources include wheat, oats, barley, corn, brown rice, bulgar, farro, millet, polenta, couscous  - Various types of beans include chickpeas, lentils, fava beans, black beans, white beans   Fruits  Veggies - Large quantities of antioxidant rich fruits & veggies; 6 or more servings  - Vegetables can be eaten raw or lightly drizzled with oil and cooked  - Vegetables common to the traditional Mediterranean Diet include: artichokes, arugula, beets, broccoli, brussel sprouts, cabbage, carrots, celery, collard greens, cucumbers, eggplant, kale, leeks, lemons, lettuce, mushrooms, okra, onions, peas, peppers, potatoes, pumpkin, radishes, rutabaga, shallots, spinach, sweet potatoes, turnips, zucchini - Fruits common to the Mediterranean Diet include: apples, apricots, avocados, cherries, clementines, dates, figs, grapefruits, grapes, melons, nectarines, oranges, peaches, pears, pomegranates, strawberries, tangerines  Fats - Replace butter and margarine with healthy oils, such as olive oil, canola oil, and tahini  - Limit nuts to no more than a handful a day  - Nuts include walnuts, almonds, pecans, pistachios, pine nuts  - Limit or avoid candied, honey roasted or heavily salted nuts - Olives are central to the [employer] - can be eaten whole or used in a variety of dishes   Meats Protein - Limiting red meat: no more than a few times a month - When eating red meat: choose lean cuts and keep the portion to the size of deck of cards - Eggs: approx. 0 to 4 times a week  - Fish and lean poultry: at least 2 a week  - Healthy protein sources include, chicken, [place], lean beef, lamb - Increase intake of seafood such as tuna, salmon, trout, mackerel, shrimp, scallops - Avoid or limit high fat processed meats such as sausage and bacon  Dairy - Include moderate amounts of low fat dairy products  - Focus on healthy dairy such as fat free yogurt, skim milk, low or reduced fat  cheese - Limit dairy products higher in fat such as whole or 2% milk, cheese, ice cream  Alcohol - Moderate amounts of red wine is ok  - No more than 5 oz daily for women (all ages) and men older than age 70  - No more than 10 oz of wine daily for men younger than 42  Other - Limit sweets and other desserts  - Use herbs and spices instead of salt to flavor foods  - Herbs and spices common to the traditional Mediterranean Diet include: basil, bay leaves, chives, cloves, cumin, fennel, garlic, lavender, marjoram, mint, oregano, parsley, pepper, rosemary, sage, savory, sumac, tarragon, thyme   It's not just a diet, it's a lifestyle:  The Mediterranean diet includes lifestyle factors typical of those in the region  Foods, drinks and meals are best eaten with others and savored Daily physical activity is important for overall good health This could be strenuous exercise like running and aerobics This could also be more leisurely activities such as walking, housework, yard-work, or taking the stairs Moderation is the key; a balanced and healthy diet accommodates most foods and drinks Consider portion sizes and frequency of consumption of certain foods   Meal Ideas & Options:  Breakfast:  Whole wheat toast or whole wheat English muffins with peanut butter & hard boiled egg  Steel cut oats topped with apples & cinnamon and skim milk  Fresh fruit: banana, strawberries, melon, berries, peaches  Smoothies: strawberries, bananas, greek yogurt, peanut butter Low fat greek yogurt with blueberries and granola  Egg white omelet with spinach and mushrooms Breakfast couscous: whole wheat couscous, apricots, skim milk, cranberries  Sandwiches:  Hummus and grilled vegetables (peppers, zucchini, squash) on whole wheat bread   Grilled chicken on whole wheat pita with lettuce, tomatoes, cucumbers or tzatziki  [place] salad on whole wheat bread: tuna salad made with greek yogurt, olives, red peppers, capers, green  onions Garlic rosemary lamb pita: lamb sauted with garlic, rosemary, salt & pepper; add lettuce, cucumber, greek yogurt to pita - flavor with lemon juice and black pepper  Seafood:  Mediterranean grilled salmon, seasoned with garlic, basil, parsley, lemon juice and black pepper Shrimp, lemon, and spinach whole-grain pasta salad made with low fat greek yogurt  Seared scallops with lemon orzo  Seared tuna steaks seasoned salt, pepper, coriander topped with tomato mixture of olives, tomatoes, olive oil, minced garlic, parsley, green onions and cappers  Meats:  Herbed greek chicken salad with kalamata olives, cucumber, feta  Red bell peppers stuffed with spinach, bulgur, lean ground beef (or lentils) & topped with feta   Kebabs: skewers of chicken, tomatoes, onions, zucchini, squash  [place] burgers: made with red onions, mint, dill, lemon juice, feta cheese topped with roasted red peppers Vegetarian Cucumber salad: cucumbers, artichoke hearts, celery, red onion, feta cheese, tossed in olive oil & lemon juice  Hummus and whole grain pita points with a greek salad (lettuce, tomato, feta, olives, cucumbers, red onion) Lentil soup with celery, carrots made with vegetable broth, garlic, salt and pepper  Tabouli salad: parsley, bulgur, mint, scallions, cucumbers, tomato, radishes, lemon juice, olive oil, salt and pepper.

## 2022-06-26 NOTE — Progress Notes (Signed)
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 RH female  with a history of  osteoporosis, B12 deficiency, Early onset of Alzheimer's Disease seen today in follow up for memory loss. Patient is currently on donepezil 10 mg daily . MRI brain personally reviewed was essentially unremarkable. Today's MMSE is 20/30 ( Last MMSE on 07/2021 was 16/30). She continues to remain active and able to perform most ADLs. Mood is stable       Follow up in 6  months. Continue Memantine 10 mg twice daily. Side effects were discussed  Continue B12 and Vit D  supplementation Continue to control mood as per PCP Recommend good control of cardiovascular risk factors.   Neuropsych testing for clarity of diagnosis and disease trajectory       Subjective:    This patient is accompanied in the office by her husband  who supplements the history.  Previous records as well as any outside records available were reviewed prior to todays visit. Patient was last seen on 01/07/22 . Last MMSE in 07/2021 was 16/30    Any changes in memory since last visit? "On and off, to a certain extent, about the same". "She may lose train of thought at times" . She spends the day with her husband, walking the dogs, reading, watching short videos . Just came from a cruise to the [place]. Patient has some difficulty remembering upcoming appt and people names of unfamiliar people . repeats oneself?  Sometimes Disoriented when walking into a room?  Patient denies except occasionally not remembering what patient came to the room for.   Leaving objects in unusual places? Denies but she asks "where does this go? ".  Wandering behavior?  denies   Any personality changes since last visit?  denies   Any worsening depression?:  denies   Hallucinations or paranoia?  denies   Seizures?    denies    Any sleep changes?   Sleeps fairly well . Denies vivid dreams " I  don't dream anymore", REM behavior or sleepwalking   Sleep apnea?   denies   Any hygiene concerns?  denies   Independent of bathing and dressing?  Endorsed  Does the patient needs help with medications?  Husband is in charge   Who is in charge of the finances? Husband  is in charge     Any changes in appetite?  Not eating as much     Patient have trouble swallowing?  denies   Does the patient cook?  Any kitchen accidents such as leaving the stove on? Patient denies   Any headaches?   denies   Chronic back pain  denies   Ambulates with difficulty?   denies   Recent falls or head injuries? denies     Unilateral weakness, numbness or tingling?    denies   Any tremors?  denies   Any anosmia?  Patient denies   Any incontinence of urine?  denies   Any bowel dysfunction?  She hs some intermittent constipation  Patient lives with husband   Does the patient drive?No longer drives     History on Initial Assessment 02/25/2019: This is a pleasant 62 year old right-handed woman with no significant past medical history, presenting for evaluation of memory loss. She feels memory changes started 3-4 months ago, PCP notes indicate she started noticing it at the beginning of the year. She mostly notices it at  work, she has worked in [employer] for the past 31 years, but recently noticed she cannot remember things and has to write notes now. She has noticed word-finding difficulties where her husband would finish sentences for her. She denies getting lost driving. No missed bills. She denies misplacing things frequently or leaving the faucet running. She does not cook. She wonders about ADD, her 2 children take medication for it and her daughter thinks she has it. She does not when younger she had to work harder because she had difficulty concentrating. She feels her mood is good, she is just frustrated with not being able to remember things, and feels it is getting worse. No clear paranoia or hallucinations. She endorses  her work has been very stressful, more stressful recently. Her parents have memory changes in their 55s. She denies any history of significant head injuries. She rarely drinks alcohol.   She denies any headaches, dizziness, diplopia, dysarthria, dysphagia, neck/back pain, focal numbness/tingling/weakness, bowel/bladder dysfunction. No anosmia, tremors, no falls. Sleep is okay, when she is stressed with work, she may stay up because they "sit on my brain." She had bloodwork last month with B12 of 348, she has started B12 supplements and is not sure there is much difference.    I personally reviewed head CT without contrast done 10/2018 for headaches, no acute changes seen.   Diagnostic Data:  MRI brain without contrast done 03/2019 with no acute changes, normal exam.    Neuropsychological testing in June 2021 indicated substantial decline in memory abilities with additional low scores on measures of naming, semantic fluency, and on select measures of processing speed and executive functioning. Memory problems do have a storage problem. Findings concerning for an incipient neurodegenerative condition, most likely Alzheimer's disease, very mild early onset dementia.   PREVIOUS MEDICATIONS:   CURRENT MEDICATIONS:  Outpatient Encounter Medications as of 06/26/2022  Medication Sig   calcium carbonate (CALCIUM 600) 600 MG TABS tablet Take 600 mg by mouth 2 (two) times daily with a meal.   donepezil (ARICEPT) 10 MG tablet Take 1 tablet (10 mg total) by mouth at bedtime.   Multiple Vitamins-Minerals (CENTRUM ADULTS PO) Take by mouth daily.   vitamin B-12 (CYANOCOBALAMIN) 1000 MCG tablet Take 1,000 mcg by mouth daily.   Vitamin D, Ergocalciferol, (DRISDOL) 1.25 MG (50000 UNIT) CAPS capsule Take 1 capsule (50,000 Units total) by mouth every 7 (seven) days.   No facility-administered encounter medications on file as of 06/26/2022.       06/27/2022    6:00 PM 07/10/2021    8:00 AM 06/30/2020    8:00 AM   MMSE - Mini Mental State Exam  Orientation to time 0 0 2  Orientation to Place 4 5 5   Registration 3 3 3   Attention/ Calculation 5 0 5  Recall 0 0 0  Language- name 2 objects 1 2 2   Language- repeat 1 1 1   Language- follow 3 step command 3 3 3   Language- read & follow direction 1 1 1   Write a sentence 1 1 1   Copy design 1 0 0  Total score 20 16 23       11/11/2019    9:00 AM 02/25/2019   10:00 AM  Montreal Cognitive Assessment   Visuospatial/ Executive (0/5) 2 4  Naming (0/3) 3 3  Attention: Read list of digits (0/2) 2 2  Attention: Read list of letters (0/1) 1 1  Attention: Serial 7 subtraction starting at 100 (0/3) 2 2  Language: Repeat  phrase (0/2) 1 2  Language : Fluency (0/1) 1 0  Abstraction (0/2) 1 2  Delayed Recall (0/5) 0 0  Orientation (0/6) 6 6  Total 19 22  Adjusted Score (based on education) 19     Objective:     PHYSICAL EXAMINATION:    VITALS:   Vitals:   06/26/22 1429  BP: (!) 100/56  Pulse: 98  Resp: 18  SpO2: (!) 80%  Weight: 158 lb (71.7 kg)  Height: 6' (1.829 m)    GEN:  The patient appears stated age and is in NAD. HEENT:  Normocephalic, atraumatic.   Neurological examination:  General: NAD, well-groomed, appears stated age. Orientation: The patient is alert. Oriented to person, place and date Cranial nerves: There is good facial symmetry.The speech is fluent and clear. No aphasia or dysarthria. Fund of knowledge is appropriate. Recent and remote memory impaired. Attention and concentration are normal.  Able to name objects and repeat phrases.  Hearing is intact to conversational tone.    Sensation: Sensation is intact to light touch throughout Motor: Strength is at least antigravity x4. DTR's 2/4 in UE/LE     Movement examination: Tone: There is normal tone in the UE/LE Abnormal movements:  no tremor.  No myoclonus.  No asterixis.   Coordination:  There is no decremation with RAM's. Normal finger to nose  Gait and Station: The  patient has no difficulty arising out of a deep-seated chair without the use of the hands. The patient's stride length is good.  Gait is cautious and narrow.    Thank you for allowing [place] the opportunity to participate in the care of this nice patient. Please do not hesitate to contact us for any questions or concerns.   Total time spent on today's visit was 43 minutes dedicated to this patient today, preparing to see patient, examining the patient, ordering tests and/or medications and counseling the patient, documenting clinical information in the EHR or other health record, independently interpreting results and communicating results to the patient/family, discussing treatment and goals, answering patient's questions and coordinating care.  Cc:  Libby Maw, MD  Sharene Butters 06/27/2022 6:34 PM

## 2022-06-28 ENCOUNTER — Encounter: Payer: Self-pay | Admitting: Psychology

## 2022-07-01 ENCOUNTER — Ambulatory Visit: Payer: Medicare Other | Admitting: Psychology

## 2022-07-01 ENCOUNTER — Encounter: Payer: Self-pay | Admitting: Psychology

## 2022-07-01 DIAGNOSIS — F02B0 Dementia in other diseases classified elsewhere, moderate, without behavioral disturbance, psychotic disturbance, mood disturbance, and anxiety: Secondary | ICD-10-CM | POA: Diagnosis not present

## 2022-07-01 DIAGNOSIS — G300 Alzheimer's disease with early onset: Secondary | ICD-10-CM

## 2022-07-01 DIAGNOSIS — R4189 Other symptoms and signs involving cognitive functions and awareness: Secondary | ICD-10-CM

## 2022-07-01 NOTE — Progress Notes (Unsigned)
NEUROPSYCHOLOGICAL EVALUATION [place]. [facility] Department of Neurology  Date of Evaluation: July 01, 2022  Reason for Referral:   Ann Burgess is a 62 y.o. right-handed Caucasian female referred by Marlowe KaysSara Wertman, PA-C, to characterize her current cognitive functioning and assist with diagnostic clarity and treatment planning in the context of subjective cognitive decline and previous testing revealing concerns for an early onset Alzheimer's disease presentation.   Assessment and Plan:   Clinical Impression(s): Ann Burgess's pattern of performance is suggestive of severe and quite diffuse cognitive impairment. Some performance variability was exhibited across visuospatial abilities. However, severe impairment was exhibited across all other domains able to be assessed given limited frustration tolerance. This includes processing speed, cognitive flexibility, expressive language, and all aspects of learning and memory. Functionally, Ann Burgess no longer drives and has had to retire from her job due to memory impairment. Her husband also fully manages medication, financial, and bill paying responsibilities. Given evidence for severe cognitive and functional impairment, she meets diagnostic criteria for a Major Neurocognitive Disorder ("dementia") at the present time.  Regarding etiology, I share concerns previously held by Dr. Roseanne RenoStewart regarding early-onset Alzheimer's disease. Since her previous evaluation in June 2021, Ann Burgess has exhibited cognitive decline across all assessed cognitive domains. In some cases, decline is quite substantial. Specific to memory, Ann Burgess had notable trouble learning information despite repeated exposure and was essentially amnestic (i.e., 0% to 14% retention) across all memory tasks requiring the spontaneous provision of information. She also performed very poorly across all recognition trials. Taken together, this suggests evidence for rapid  forgetting and a pronounced storage impairment, both of which are the hallmark characteristics of this illness.   While diffuse impairment makes testing patterns unable to be discerned, she does not exhibit behavioral characteristics of Lewy body disease, another more rare parkinsonian condition, or frontotemporal lobar degeneration. Her most recent brain MRI in 2020 was unremarkable, thus not suggestive of a strong vascular contribution. Given this, the presence of Alzheimer's disease remains the most likely neurodegenerative explanation for her current presentation. Continued medical monitoring will be important moving forward.   Recommendations: Updated neuroimaging in the form of a brain MRI could be considered given that her previous scan was performed in 2020. Given her age, consideration for a lumbar puncture is also reasonable. She should discuss the pros and cons of these additional tests with Ms. Wertman and her medical team.   Ann Burgess has already been prescribed a medication aimed to address memory loss and concerns surrounding Alzheimer's disease (i.e., donepezil/Aricept). She is encouraged to continue taking this medication as prescribed. It is important to highlight that this medication has been shown to slow functional decline in some individuals. There is no current treatment which can stop or reverse cognitive decline when caused by a neurodegenerative illness.   I agree with her family and medical team's decision to have Ann Burgess fully abstain from all driving pursuits.   It will be important for her to have another person with her when in situations where she may need to process information, weigh the pros and cons of different options, and make decisions, in order to ensure that she fully understands and recalls all information to be considered.  If not already done, Ann Burgess and her family may want to discuss her wishes regarding durable power of attorney and medical decision  making, so that she can have input into these choices. Additionally, they may wish to discuss future plans for  caretaking and seek out community options for in home/residential care should they become necessary.  [name] is encouraged to attend to lifestyle factors for brain health (e.g., regular physical exercise, good nutrition habits, regular participation in cognitively-stimulating activities, and general stress management techniques), which are likely to have benefits for both emotional adjustment and cognition. Optimal control of vascular risk factors (including safe cardiovascular exercise and adherence to dietary recommendations) is encouraged. Continued participation in activities which provide mental stimulation and social interaction is also recommended.   Important information should be provided to Ann Burgess in written format in all instances. This information should be placed in a highly frequented and easily visible location within her home to promote recall. External strategies such as written notes in a consistently used memory journal, visual and nonverbal auditory cues such as a calendar on the refrigerator or appointments with alarm, such as on a cell phone, can also help maximize recall.  Review of Records:   Ann Burgess was seen by [facility] Ellouise Newer, M.D.) on 02/25/2019 for an evaluation of memory loss. At that time, memory concerns were said to be present for the prior 3-4 months (however, her PCP noted concerns dating back to the beginning of the year). Examples included needing to take written notes to perform work functions, as well as ongoing word-finding difficulties. ADLs were described as intact at that time. Performance on a brief cognitive screening instrument (MOCA) was 22/30.  Ann Burgess completed a comprehensive neuropsychological evaluation Alphonzo Severance, Psy.D.) on 11/11/2019. Results suggested "a substantial decline in memory abilities with additional low scores  on measures of naming, semantic fluency, and on select measures of processing speed and executive functioning. Her memory problems do have a storage pattern. She performed within gross limits of normal on measures of working memory and did well on measures of visuospatial abilities. She reported moderate levels of anxiety that are to some extent understandable given her current circumstances and screened in the mild range for depressive symptoms. Her husband rates her as falling at an MCI level of dysfunction. Ms. Markwood is demonstrating cognitive problems that are concerning for an incipient neurodegenerative condition, which is most likely Alzheimer's disease. I think that very mild early onset dementia is the best classification of her current severity, because she is clearly having problems functioning at work." Repeat testing was recommended.   Ms. Winton was most recently seen by [facility] Sharene Butters, PA-C) on 06/26/2022 for continued care of memory impairment and cognitive decline. At that time, she described similar difficulties with memory and word finding, as well as commonly losing her train of thought. Her husband manages medications and financial responsibilities and Ms. Ciccone no longer drives. She also retired in the interim given cognitive concerns. Performance on a brief cognitive screening instrument (MMSE) was 20/30. Ultimately, Ms. Kern was referred for a comprehensive neuropsychological evaluation to characterize her cognitive abilities and to assist with diagnostic clarity and treatment planning.   Brain MRI on 03/26/2019 was unremarkable. No additional neuroimaging was available for review.   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   GERD (gastroesophageal reflux disease) 03/11/2018   History of Guillain-Barre syndrome 2017   10/2015   Migraine headache 03/11/2018   Numbness 10/16/2015   Osteopenia of neck of femur 05/14/2021    Patellofemoral syndrome of left knee 11/23/2021   Postmenopausal 03/11/2018   Saddle anesthesia 10/16/2015   Vitamin B12 deficiency 04/13/2021   Vitamin D deficiency 04/13/2021  Past Surgical History:  Procedure Laterality Date   TUBAL LIGATION      Current Outpatient Medications:    calcium carbonate (CALCIUM 600) 600 MG TABS tablet, Take 600 mg by mouth 2 (two) times daily with a meal., Disp: , Rfl:    donepezil (ARICEPT) 10 MG tablet, Take 1 tablet (10 mg total) by mouth at bedtime., Disp: 90 tablet, Rfl: 3   Multiple Vitamins-Minerals (CENTRUM ADULTS PO), Take by mouth daily., Disp: , Rfl:    vitamin B-12 (CYANOCOBALAMIN) 1000 MCG tablet, Take 1,000 mcg by mouth daily., Disp: , Rfl:    Vitamin D, Ergocalciferol, (DRISDOL) 1.25 MG (50000 UNIT) CAPS capsule, Take 1 capsule (50,000 Units total) by mouth every 7 (seven) days., Disp: 5 capsule, Rfl: 1  Clinical Interview:   The following information was obtained during a clinical interview with Ms. Pandey and her husband prior to cognitive testing.  Cognitive Symptoms: Decreased short-term memory: Endorsed. However, Ms. Eakes generally minimized memory concerns. With direct questioning, she denied trouble recalling details of recent conversations or names of familiar individuals. She initially denied trouble misplacing or losing things before her husband corrected her. She eventually described memory lapses "not all the time." Her husband described greater memory dysfunction, including Ms. Mcreynolds being repetitive in conversation. He noted that cognitive difficulties have progressively worsened since her previous evaluation in June 2021.  Decreased long-term memory: Denied. Decreased attention/concentration: Denied. Her husband did note that Ms. Witte will commonly lose her train of thought while speaking.  Reduced processing speed: Denied. Difficulties with executive functions: Denied. They also denied trouble with impulsivity or any significant  personality changes.  Difficulties with emotion regulation: Denied. Difficulties with receptive language: Denied. Difficulties with word finding: Endorsed. This also seems to have worsened since her previous evaluation.  Decreased visuoperceptual ability: Denied.  Difficulties completing ADLs: Endorsed. Her husband fully manages medication, financial, and bill paying responsibilities. She no longer drives at the advice of her medical team. Since previous testing, she also retired from her position at work due to cognitive decline.   Additional Medical History: History of traumatic brain injury/concussion: Denied. History of stroke: Denied. History of seizure activity: Denied. History of known exposure to toxins: Denied. Symptoms of chronic pain: Denied. Experience of frequent headaches/migraines: Denied. Frequent instances of dizziness/vertigo: Denied.  Sensory changes: She wears glasses with benefit. Other sensory changes/difficulties (e.g., hearing, taste, smell) were denied.  Balance/coordination difficulties: Denied. She also denied any recent falls.  Other motor difficulties: Denied.  Sleep History: Estimated hours obtained each night: 7-8 hours.  Difficulties falling asleep: Denied. Difficulties staying asleep: Denied. Feels rested and refreshed upon awakening: Endorsed.  History of snoring: Denied. History of waking up gasping for air: Denied. Witnessed breath cessation while asleep: Denied.  History of vivid dreaming: Denied. Excessive movement while asleep: Denied. Instances of acting out her dreams: Denied.  Psychiatric/Behavioral Health History: Depression: She described her current mood as "good" and denied to her knowledge any previous mental health concerns or diagnoses. Current or remote suicidal ideation, intent, or plan was denied.  Anxiety: Denied. Mania: Denied. Trauma History: Denied. Visual/auditory hallucinations: Denied. Delusional thoughts:  Denied.  Tobacco: Denied. Alcohol: She reported very rare alcohol consumption and denied a history of problematic alcohol abuse or dependence.  Recreational drugs: Denied.  Family History: Problem Relation Age of Onset   Alzheimer's disease Mother    Dementia Mother    Alzheimer's disease Father    Dementia Father    Alzheimer's disease Sister  genetic predisposition; no outward symptoms currently   Breast cancer Neg Hx    This information was confirmed by Ms. Headen.  Academic/Vocational History: Highest level of educational attainment: 15 years. She graduated from high school and reported completing three additional years of college. She did not earn a formal college degree. She described herself as a good (A/B) student in academic settings. No relative weaknesses were reported.  History of developmental delay: Denied. History of grade repetition: Denied. Enrollment in special education courses: Denied. History of LD/ADHD: Denied.  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 While she was working full-time (with some admitted difficulty) at the time of her previous evaluation in June 2021, she and her husband reported that the results of that evaluation prompted her to retire from her position.   Evaluation Results:   Behavioral Observations: Ann Burgess was accompanied by her husband, arrived to her appointment on time, and was appropriately dressed and groomed. She appeared alert. Observed gait and station were within normal limits. Gross motor functioning appeared intact upon informal observation and no abnormal movements (e.g., tremors) were noted. Her affect was generally relaxed and positive, but did range appropriately given the subject  being discussed during the clinical interview or the task at hand during testing procedures. Spontaneous speech was fluent and word finding difficulties were not observed during the clinical interview. Thought processes were coherent, organized, and normal in content. Insight into her cognitive difficulties appeared poor as she generally denied prominent cognitive dysfunction during interview despite current objective testing revealing severe and fairly diffuse impairment.   During testing, sustained attention was appropriate. Task engagement was initially adequate and she persisted when challenged. However, her frustration levels quickly elevated after the completion of memory testing. While she was agreeable to complete a few tasks after memory testing was completed when encouraged by the psychometrist, this frustration eventually culminated to where Ms. Follett discontinued the evaluation prematurely. As such, not all tasks were able to be administered and some domains were inadequately sampled or unable to be sampled at all. Overall, Ann Burgess was cooperative with the clinical interview and somewhat with subsequent testing procedures.   Adequacy of Effort: The validity of neuropsychological testing is limited by the extent to which the individual being tested may be assumed to have exerted adequate effort during testing. Ann Burgess expressed her intention to perform to the best of her abilities and exhibited adequate task engagement and persistence. Her performance across a single stand-alone performance validity task was below expectation. However, this is believed to be due to true and quite severe memory impairment rather than poor engagement or attempts to perform poorly. As such, the results of the current evaluation are believed to be a generally valid representation of Ms. Wilbert's current cognitive functioning.  Test Results: Ann Burgess was very disoriented at the time of the current evaluation. She  incorrectly stated her age 60("62"), was unable to state her street address or the city she resides in, and was unable to provide a phone number. She was also unable to state the current year, month, date, time, name of the current clinic, or the city the current clinic was located in.   Intellectual abilities based upon educational and vocational attainment were estimated to be in the  average range. Premorbid abilities were estimated to be within the average range based upon a single-word reading test.   Processing speed was well below average. Basic attention was unable to be assessed. More complex attention (e.g., working memory) was also unable to be assessed. Cognitive flexibility was exceptionally low. Other aspects of executive functioning were unable to be assessed. Safety/judgment was unable to be assessed.   Receptive language abilities were unable to be assessed. This domain may still be reasonably intact as Ms. Burling did not exhibit prominent difficulties comprehending task instructions and answered all questions asked of her adequately (despite having poor insight) during interview. Assessed expressive language was exceptionally low.     Assessed visuospatial/visuoconstructional abilities were variable, ranging from the exceptionally low to average normative ranges. For her drawing of a clock, she drew the outer circle and then several lines, ultimately resembling spokes of a wagon wheel. She was unable to place any numbers on the clock or make any attempt at placing the clock hands.    Learning (i.e., encoding) of novel verbal and visual information was exceptionally low. Spontaneous delayed recall (i.e., retrieval) of previously learned information was average across a shape learning task but exceptionally low across all verbal tasks. Retention rates were 0% across a story learning task, 0% across a list learning task, 14% across a daily living task, and 125% across a shape learning task  (improved via happenstance guessing rather than evidence for intact retention). Performance across recognition tasks was exceptionally low, suggesting negligible evidence for information consolidation.   Results of emotional screening instruments suggested that recent symptoms of generalized anxiety were in the minimal range, while symptoms of depression were within normal limits. A screening instrument assessing recent sleep quality suggested the presence of minimal sleep dysfunction.  Tables of Scores:   Note: This summary of test scores accompanies the interpretive report and should not be considered in isolation without reference to the appropriate sections in the text. Descriptors are based on appropriate normative data and may be adjusted based on clinical judgment. Terms such as "Within Normal Limits" and "Outside Normal Limits" are used when a more specific description of the test score cannot be determined.       Percentile - Normative Descriptor > 98 - Exceptionally High 91-97 - Well Above Average 75-90 - Above Average 25-74 - Average 9-24 - Below Average 2-8 - Well Below Average < 2 - Exceptionally Low       Validity:   DESCRIPTOR       ACS Word Choice: --- --- Outside Normal Limits       Orientation:      Raw Score Percentile   NAB Orientation, Form 1 12/29 --- ---       Cognitive Screening:      Raw Score Percentile   SLUMS: 6/30 --- ---       Intellectual Functioning:      Standard Score Percentile   Test of Premorbid Functioning: 97 42 Average       Memory:     NAB Memory Module, Form 1: Standard Score/ T Score Percentile   Total Memory Index 49 <1 Exceptionally Low  List Learning       Total Trials 1-3 8/36 (19) <1 Exceptionally Low    List B 2/12 (32) 4 Well Below Average    Short Delay Free Recall 0/12 (19) <1 Exceptionally Low    Long Delay Free Recall 0/12 (20) <1 Exceptionally Low    Retention Percentage 0 (<19) <  1 Exceptionally Low    Recognition  Discriminability -1 (19) <1 Exceptionally Low  Shape Learning       Total Trials 1-3 6/27 (19) <1 Exceptionally Low    Delayed Recall 5/9 (43) 25 Average    Retention Percentage 125 (60) 84 Above Average    Recognition Discriminability 3 (27) 1 Exceptionally Low  Story Learning       Immediate Recall 11/80 (19) <1 Exceptionally Low    Delayed Recall 0/40 (25) <1 Exceptionally Low    Retention Percentage 0 (<19) <1 Exceptionally Low  Daily Living Memory       Immediate Recall 15/51 (19) <1 Exceptionally Low    Delayed Recall 1/17 (19) <1 Exceptionally Low    Retention Percentage 14 (<19) <1 Exceptionally Low    Recognition Hits 3/10 (<19) <1 Exceptionally Low       Attention/Executive Function:     Trail Making Test (TMT): Raw Score (T Score) Percentile     Part A 56 secs.,  0 errors (30) 2 Well Below Average    Part B Discontinued --- Impaired         Scaled Score Percentile   WAIS-IV Coding: 4 2 Well Below Average       D-KEFS Verbal Fluency Test: Raw Score (Scaled Score) Percentile     Letter Total Correct 11 (2) <1 Exceptionally Low    Category Total Correct 5 (1) <1 Exceptionally Low    Category Switching Total Correct 2 (1) <1 Exceptionally Low    Category Switching Accuracy 1 (1) <1 Exceptionally Low       Language:     Verbal Fluency Test: Raw Score (T Score) Percentile     Phonemic Fluency (FAS) 11 (<19) <1 Exceptionally Low    Animal Fluency 3 (<19) <1 Exceptionally Low        NAB Language Module, Form 1: T Score Percentile     Naming 13/31 (19) <1 Exceptionally Low       Visuospatial/Visuoconstruction:      Raw Score Percentile   Clock Drawing: 2/10 --- Impaired       NAB Spatial Module, Form 1: T Score Percentile     Visual Discrimination 33 5 Well Below Average    Design Construction 47 38 Average       Mood and Personality:      Raw Score Percentile   PROMIS Depression Questionnaire: 8 --- None to Slight  PROMIS Anxiety Questionnaire: 8 --- None to  Slight       Additional Questionnaires:      Raw Score Percentile   PROMIS Sleep Disturbance Questionnaire: 8 --- None to Slight   Informed Consent and Coding/Compliance:   The current evaluation represents a clinical evaluation for the purposes previously outlined by the referral source and is in no way reflective of a forensic evaluation.   Ms. Rayos was provided with a verbal description of the nature and purpose of the present neuropsychological evaluation. Also reviewed were the foreseeable risks and/or discomforts and benefits of the procedure, limits of confidentiality, and mandatory reporting requirements of this provider. The patient was given the opportunity to ask questions and receive answers about the evaluation. Oral consent to participate was provided by the patient.   This evaluation was conducted by Newman Nickels, Ph.D., ABPP-CN, board certified clinical neuropsychologist. Ms. Cibrian completed a clinical interview with Dr. Milbert Coulter, billed as one unit [phone], and 145 minutes of cognitive testing and scoring, billed as one unit [phone] and four  additional units [id number]. Psychometrist Milana Kidney, B.S., assisted Dr. Melvyn Novas with test administration and scoring procedures. As a separate and discrete service, Dr. Melvyn Novas spent a total of 160 minutes in interpretation and report writing billed as one unit [phone] and two units [id number].

## 2022-07-01 NOTE — Progress Notes (Unsigned)
   Psychometrician Note   Cognitive testing was administered to Ann Burgess by Milana Kidney, B.S. (psychometrist) under the supervision of Dr. Christia Reading, Ph.D., licensed psychologist on 07/01/2022. Ann Burgess did not appear overtly distressed by the testing session per behavioral observation or responses across self-report questionnaires. Rest breaks were offered.    The battery of tests administered was selected by Dr. Christia Reading, Ph.D. with consideration to Ann Burgess's current level of functioning, the nature of her symptoms, emotional and behavioral responses during interview, level of literacy, observed level of motivation/effort, and the nature of the referral question. This battery was communicated to the psychometrist. Communication between Dr. Christia Reading, Ph.D. and the psychometrist was ongoing throughout the evaluation and Dr. Christia Reading, Ph.D. was immediately accessible at all times. Dr. Christia Reading, Ph.D. provided supervision to the psychometrist on the date of this service to the extent necessary to assure the quality of all services provided.    Ann Burgess will return within approximately 1-2 weeks for an interactive feedback session with Dr. Melvyn Novas at which time her test performances, clinical impressions, and treatment recommendations will be reviewed in detail. Ann Burgess understands she can contact our office should she require our assistance before this time.  A total of 145 minutes of billable time were spent face-to-face with Ann Burgess by the psychometrist. This includes both test administration and scoring time. Billing for these services is reflected in the clinical report generated by Dr. Christia Reading, Ph.D.  This note reflects time spent with the psychometrician and does not include test scores or any clinical interpretations made by Dr. Melvyn Novas. The full report will follow in a separate note.

## 2022-07-08 ENCOUNTER — Ambulatory Visit (INDEPENDENT_AMBULATORY_CARE_PROVIDER_SITE_OTHER): Payer: Medicare Other | Admitting: Psychology

## 2022-07-08 DIAGNOSIS — F02B0 Dementia in other diseases classified elsewhere, moderate, without behavioral disturbance, psychotic disturbance, mood disturbance, and anxiety: Secondary | ICD-10-CM

## 2022-07-08 DIAGNOSIS — G300 Alzheimer's disease with early onset: Secondary | ICD-10-CM

## 2022-07-08 NOTE — Progress Notes (Signed)
   Neuropsychology Note [facility]     Ann Burgess left a voicemail after clinic hours on 07/05/2022 stating a need to reschedule her feedback appoint due to an unexpected event. She was rescheduled to 07/09/2022 at 2:30pm.

## 2022-07-09 ENCOUNTER — Ambulatory Visit: Payer: Medicare Other | Admitting: Psychology

## 2022-07-09 DIAGNOSIS — G300 Alzheimer's disease with early onset: Secondary | ICD-10-CM | POA: Diagnosis not present

## 2022-07-09 DIAGNOSIS — F02B0 Dementia in other diseases classified elsewhere, moderate, without behavioral disturbance, psychotic disturbance, mood disturbance, and anxiety: Secondary | ICD-10-CM

## 2022-07-09 NOTE — Progress Notes (Signed)
   Neuropsychology Feedback Session [facility]  Reason for Referral:   Mahreen Schewe is a 62 y.o. right-handed Caucasian female referred by Sharene Butters, PA-C, to characterize her current cognitive functioning and assist with diagnostic clarity and treatment planning in the context of subjective cognitive decline and previous testing revealing concerns for an early onset Alzheimer's disease presentation.   Feedback:   Ms. Nogueira completed a comprehensive neuropsychological evaluation on 07/01/2022. Please refer to that encounter for the full report and recommendations. Briefly, results suggested severe and quite diffuse cognitive impairment. Some performance variability was exhibited across visuospatial abilities. However, severe impairment was exhibited across all other domains able to be assessed given limited frustration tolerance. This includes processing speed, cognitive flexibility, expressive language, and all aspects of learning and memory. Regarding etiology, I share concerns previously held by Dr. Nicole Kindred regarding early-onset Alzheimer's disease. Since her previous evaluation in June 2021, Ms. Melick has exhibited cognitive decline across all assessed cognitive domains. In some cases, decline is quite substantial. Specific to memory, Ms. Minervini had notable trouble learning information despite repeated exposure and was essentially amnestic (i.e., 0% to 14% retention) across all memory tasks requiring the spontaneous provision of information. She also performed very poorly across all recognition trials. Taken together, this suggests evidence for rapid forgetting and a pronounced storage impairment, both of which are the hallmark characteristics of this illness.   Ms. Frankowski was accompanied by her husband during the current feedback session. Content of the current session focused on the results of her neuropsychological evaluation. Ms. Weyman was given  the opportunity to ask questions and her questions were answered. She was encouraged to reach out should additional questions arise. A copy of her report was provided at the conclusion of the visit.      18 minutes were spent conducting the current feedback session with Ms. Panebianco, billed as one unit [phone].

## 2022-07-10 ENCOUNTER — Ambulatory Visit: Payer: BC Managed Care – PPO | Admitting: Physician Assistant

## 2022-07-23 ENCOUNTER — Ambulatory Visit: Payer: Medicare Other | Admitting: Psychology

## 2022-08-15 DIAGNOSIS — M7122 Synovial cyst of popliteal space [Baker], left knee: Secondary | ICD-10-CM | POA: Diagnosis not present

## 2022-08-16 ENCOUNTER — Encounter: Payer: Self-pay | Admitting: Family Medicine

## 2022-08-16 ENCOUNTER — Ambulatory Visit (INDEPENDENT_AMBULATORY_CARE_PROVIDER_SITE_OTHER): Payer: Medicare Other | Admitting: Family Medicine

## 2022-08-16 VITALS — BP 106/64 | HR 91 | Temp 97.3°F | Ht 72.0 in | Wt 159.4 lb

## 2022-08-16 DIAGNOSIS — Z0000 Encounter for general adult medical examination without abnormal findings: Secondary | ICD-10-CM | POA: Diagnosis not present

## 2022-08-16 DIAGNOSIS — M8580 Other specified disorders of bone density and structure, unspecified site: Secondary | ICD-10-CM

## 2022-08-16 DIAGNOSIS — E538 Deficiency of other specified B group vitamins: Secondary | ICD-10-CM

## 2022-08-16 DIAGNOSIS — E559 Vitamin D deficiency, unspecified: Secondary | ICD-10-CM

## 2022-08-16 LAB — LIPID PANEL
Cholesterol: 248 mg/dL — ABNORMAL HIGH (ref 0–200)
HDL: 86.7 mg/dL (ref >39.00)
LDL Cholesterol: 147 mg/dL — ABNORMAL HIGH (ref 0–99)
NonHDL: 161.21
Total CHOL/HDL Ratio: 3
Triglycerides: 73 mg/dL (ref 0.0–149.0)
VLDL: 14.6 mg/dL (ref 0.0–40.0)

## 2022-08-16 LAB — URINALYSIS, ROUTINE W REFLEX MICROSCOPIC
Leukocytes,Ua: NEGATIVE
Nitrite: NEGATIVE
Specific Gravity, Urine: >=1.03 — AB (ref 1.000–1.030)
Total Protein, Urine: NEGATIVE
Urine Glucose: NEGATIVE
Urobilinogen, UA: 0.2 (ref 0.0–1.0)
pH: 5.5 (ref 5.0–8.0)

## 2022-08-16 LAB — COMPREHENSIVE METABOLIC PANEL
ALT: 11 U/L (ref 0–35)
AST: 17 U/L (ref 0–37)
Albumin: 4.4 g/dL (ref 3.5–5.2)
Alkaline Phosphatase: 44 U/L (ref 39–117)
BUN: 22 mg/dL (ref 6–23)
CO2: 32 mEq/L (ref 19–32)
Calcium: 9.9 mg/dL (ref 8.4–10.5)
Chloride: 100 mEq/L (ref 96–112)
Creatinine, Ser: 1.01 mg/dL (ref 0.40–1.20)
GFR: 60.73 mL/min (ref >60.00)
Glucose, Bld: 91 mg/dL (ref 70–99)
Potassium: 4.3 mEq/L (ref 3.5–5.1)
Sodium: 141 mEq/L (ref 135–145)
Total Bilirubin: 0.6 mg/dL (ref 0.2–1.2)
Total Protein: 6.9 g/dL (ref 6.0–8.3)

## 2022-08-16 LAB — CBC
HCT: 37 % (ref 36.0–46.0)
Hemoglobin: 12.6 g/dL (ref 12.0–15.0)
MCHC: 34.1 g/dL (ref 30.0–36.0)
MCV: 89.4 fl (ref 78.0–100.0)
Platelets: 221 10*3/uL (ref 150.0–400.0)
RBC: 4.14 Mil/uL (ref 3.87–5.11)
RDW: 13.4 % (ref 11.5–15.5)
WBC: 3.7 10*3/uL — ABNORMAL LOW (ref 4.0–10.5)

## 2022-08-16 LAB — VITAMIN D 25 HYDROXY (VIT D DEFICIENCY, FRACTURES): VITD: 28.53 ng/mL — ABNORMAL LOW (ref 30.00–100.00)

## 2022-08-16 LAB — VITAMIN B12: Vitamin B-12: >1500 pg/mL — ABNORMAL HIGH (ref 211–911)

## 2022-08-16 NOTE — Progress Notes (Signed)
Established Patient Office Visit   Subjective:  Patient ID: Ann Burgess, female    DOB: [birth year]  Age: 62 y.o. MRN: [id number]  Chief Complaint  Patient presents with   Annual Exam    CPE, no concerns. Patient fasting.     HPI Encounter Diagnoses  Name Primary?   Vitamin D deficiency Yes   B12 deficiency    Healthcare maintenance    Osteopenia, unspecified location    For yearly health check.  Accompanied by her husband.  Continue to follow-up with neurology for early onset Alzheimer's.  Up-to-date on health maintenance.  Has regular dental care.  Exercise is limited currently secondary to left knee pain.  Follow-up is scheduled with   Review of Systems  Constitutional: Negative.   HENT: Negative.    Eyes:  Negative for blurred vision, discharge and redness.  Respiratory: Negative.    Cardiovascular: Negative.   Gastrointestinal:  Negative for abdominal pain.  Genitourinary: Negative.   Musculoskeletal:  Positive for joint pain. Negative for myalgias.  Skin:  Negative for rash.  Neurological:  Negative for tingling, loss of consciousness and weakness.  Endo/Heme/Allergies:  Negative for polydipsia.     Current Outpatient Medications:    calcium carbonate (CALCIUM 600) 600 MG TABS tablet, Take 600 mg by mouth 2 (two) times daily with a meal., Disp: , Rfl:    donepezil (ARICEPT) 10 MG tablet, Take 1 tablet (10 mg total) by mouth at bedtime., Disp: 90 tablet, Rfl: 3   Multiple Vitamins-Minerals (CENTRUM ADULTS PO), Take by mouth daily., Disp: , Rfl:    vitamin B-12 (CYANOCOBALAMIN) 1000 MCG tablet, Take 1,000 mcg by mouth daily., Disp: , Rfl:    Vitamin D, Ergocalciferol, (DRISDOL) 1.25 MG (50000 UNIT) CAPS capsule, Take 1 capsule (50,000 Units total) by mouth every 7 (seven) days., Disp: 5 capsule, Rfl: 1   Objective:     BP 106/64 (BP Location: Right Arm, Patient Position: Sitting, Cuff Size: Normal)   Pulse 91   Temp (!) 97.3 F (36.3 C) (Temporal)   Ht 6'  (1.829 m)   Wt 159 lb 6.4 oz (72.3 kg)   SpO2 98%   BMI 21.62 kg/m    Physical Exam Constitutional:      General: She is not in acute distress.    Appearance: Normal appearance. She is not ill-appearing, toxic-appearing or diaphoretic.  HENT:     Head: Normocephalic and atraumatic.     Right Ear: Tympanic membrane, ear canal and external ear normal.     Left Ear: Tympanic membrane, ear canal and external ear normal.     Mouth/Throat:     Mouth: Mucous membranes are moist.     Pharynx: Oropharynx is clear. No oropharyngeal exudate or posterior oropharyngeal erythema.  Eyes:     General: No scleral icterus.       Right eye: No discharge.        Left eye: No discharge.     Extraocular Movements: Extraocular movements intact.     Conjunctiva/sclera: Conjunctivae normal.     Pupils: Pupils are equal, round, and reactive to light.  Cardiovascular:     Rate and Rhythm: Normal rate and regular rhythm.  Pulmonary:     Effort: Pulmonary effort is normal. No respiratory distress.     Breath sounds: Normal breath sounds.  Abdominal:     General: Bowel sounds are normal.  Musculoskeletal:     Cervical back: No rigidity or tenderness.     Left knee: No swelling  or effusion. Normal range of motion.  Skin:    General: Skin is warm and dry.  Neurological:     Mental Status: She is alert and oriented to person, place, and time.  Psychiatric:        Mood and Affect: Mood normal.        Behavior: Behavior normal.      No results found for any visits on 08/16/22.    The 10-year ASCVD risk score (Arnett DK, et al., 2019) is: 3.8%    Assessment & Plan:   Vitamin D deficiency -     VITAMIN D 25 Hydroxy (Vit-D Deficiency, Fractures)  B12 deficiency -     Vitamin B12  Healthcare maintenance -     CBC -     Comprehensive metabolic panel -     Lipid panel -     Urinalysis, Routine w reflex microscopic  Osteopenia, unspecified location    Return in about 1 year (around  08/16/2023).  Information was given on health maintenance and disease prevention.  Recommended follow-up with GYN provider.  Libby Maw, MD

## 2022-08-19 DIAGNOSIS — M25562 Pain in left knee: Secondary | ICD-10-CM | POA: Diagnosis not present

## 2022-08-19 MED ORDER — VITAMIN D3 125 MCG (5000 UT) PO CAPS: 5000.0000 [IU] | ORAL_CAPSULE | Freq: Every day | ORAL | 5 refills | Status: DC

## 2022-08-19 NOTE — Addendum Note (Signed)
Addended by: Jon Billings on: 08/19/2022 07:48 AM   Modules accepted: Orders

## 2022-08-26 ENCOUNTER — Telehealth: Payer: Self-pay

## 2022-08-26 ENCOUNTER — Other Ambulatory Visit: Payer: Self-pay

## 2022-08-26 DIAGNOSIS — E559 Vitamin D deficiency, unspecified: Secondary | ICD-10-CM

## 2022-08-26 MED ORDER — VITAMIN D3 125 MCG (5000 UT) PO CAPS: 5000.0000 [IU] | ORAL_CAPSULE | Freq: Every day | ORAL | 5 refills | Status: AC

## 2022-08-26 NOTE — Telephone Encounter (Signed)
Called pharm to confirm receipt of script. Not carried by pharmacy, script is OTC strength, pt instructed by pharm to purchase OTC.

## 2022-09-20 DIAGNOSIS — H53032 Strabismic amblyopia, left eye: Secondary | ICD-10-CM | POA: Diagnosis not present

## 2022-09-20 DIAGNOSIS — H524 Presbyopia: Secondary | ICD-10-CM | POA: Diagnosis not present

## 2022-09-24 ENCOUNTER — Telehealth: Payer: Self-pay | Admitting: Family Medicine

## 2022-09-24 NOTE — Telephone Encounter (Signed)
Contacted Ann Burgess to schedule their annual wellness visit. Welcome to Medicare visit Due by 05/04/23.  Ann Burgess AWV direct phone # [phone]   WTM before 05/04/23 per [facility]

## 2022-12-04 ENCOUNTER — Telehealth: Payer: Self-pay | Admitting: Family Medicine

## 2022-12-04 NOTE — Telephone Encounter (Signed)
Pts husband called asking for a gynecologist you would recommend.

## 2022-12-25 ENCOUNTER — Encounter: Payer: Self-pay | Admitting: Physician Assistant

## 2022-12-25 ENCOUNTER — Ambulatory Visit: Payer: Medicare Other | Admitting: Physician Assistant

## 2022-12-25 VITALS — BP 104/62 | Resp 18 | Ht 72.0 in | Wt 156.0 lb

## 2022-12-25 DIAGNOSIS — Z7689 Persons encountering health services in other specified circumstances: Secondary | ICD-10-CM | POA: Diagnosis not present

## 2022-12-25 DIAGNOSIS — Z1231 Encounter for screening mammogram for malignant neoplasm of breast: Secondary | ICD-10-CM | POA: Diagnosis not present

## 2022-12-25 DIAGNOSIS — F02B0 Dementia in other diseases classified elsewhere, moderate, without behavioral disturbance, psychotic disturbance, mood disturbance, and anxiety: Secondary | ICD-10-CM

## 2022-12-25 DIAGNOSIS — Z01419 Encounter for gynecological examination (general) (routine) without abnormal findings: Secondary | ICD-10-CM | POA: Diagnosis not present

## 2022-12-25 DIAGNOSIS — G300 Alzheimer's disease with early onset: Secondary | ICD-10-CM

## 2022-12-25 DIAGNOSIS — F02B3 Dementia in other diseases classified elsewhere, moderate, with mood disturbance: Secondary | ICD-10-CM | POA: Diagnosis not present

## 2022-12-25 DIAGNOSIS — Z1330 Encounter for screening examination for mental health and behavioral disorders, unspecified: Secondary | ICD-10-CM | POA: Diagnosis not present

## 2022-12-25 DIAGNOSIS — R1032 Left lower quadrant pain: Secondary | ICD-10-CM | POA: Diagnosis not present

## 2022-12-25 MED ORDER — MEMANTINE HCL 5 MG PO TABS: ORAL_TABLET | ORAL | 11 refills | Status: AC

## 2022-12-25 NOTE — Progress Notes (Signed)
Assessment/Plan:   Moderate early onset Alzheimer's disease  Ann Burgess is a very pleasant 62 y.o. RH female with a history of osteoporosis, B12 deficiency, moderate Early onset of Alzheimer's Disease per latest neuropsych evaluation in February 2024 seen today in follow up for memory loss. Patient is currently on donepezil 10 mg daily.  Unfortunately, her husband became sick with stage IV pancreatic cancer, and she is undergoing significant amount of stress and sadness related to this diagnosis.  She is having plans to move to [facility] to live with her daughter and establish continuation of care at [facility] neurology.  Will gladly proceed with referral to the facility.  Cognitively, there is a slight decline, with MMSE today at 17/30.  She is still able to participate in her ADLs, she no longer drives.  It has been a privilege to care for this nice patient.  Referral to [facility] neurology for continuation of care Continue donepezil 10 mg daily Start memantine 5 mg twice daily, goal of 10 mg twice daily if tolerated.  Side effects discussed Continue B12 and D3 supplements Recommend good control of her cardiovascular risk factors Continue to control mood as per PCP   Subjective:    This patient is accompanied in the office by her daughter who supplements the history.  Previous records as well as any outside records available were reviewed prior to todays visit. Patient was last seen on 06/26/2022 with an MMSE of 20/30.    Any changes in memory since last visit? "  Memory may be affected by sadness due to the new diagnosis of stage IV pancreatic cancer in her husband "daughter says.  She may lose her train of thought at times ".  She still walks the dogs, but does not do as much reading as before.  Has difficulty remembering appointments, recent conversations, new information or names of unfamiliar people. repeats oneself?  Endorsed Disoriented when walking into a room?  Patient denies     Leaving objects in unusual places?  May misplace things but not in unusual places   Wandering behavior?  denies   Any personality changes since last visit?  denies   Any worsening depression?:  Patient may be experiencing situational depression in view of her husband's diagnosis of end-stage pancreatic cancer.  Her husband is in an experimental treatment right now, but at this point, her daughter reports that he may have 6 months or less today if indeed his affected very much.  She becomes tearful when speaking about it. Hallucinations or paranoia?  denies   Seizures? denies    Any sleep changes?  Denies vivid dreams, REM behavior or sleepwalking   Sleep apnea?   denies   Any hygiene concerns? denies  Independent of bathing and dressing?  Endorsed  Does the patient needs help with medications? Patient is in charge  Who is in charge of the finances?  Husband is in charge  but daughter is working on being FPOA    Any changes in appetite?  denies     Patient have trouble swallowing? denies   Does the patient cook? No Any headaches?   denies   Chronic back pain  denies   Ambulates with difficulty? denies    Recent falls or head injuries? denies     Unilateral weakness, numbness or tingling? denies   Any tremors?  denies   Any anosmia?  Patient denies   Any incontinence of urine?  Denies.   Any bowel dysfunction?  denies      Patient is in the process of moving to [facility] to be with her daughter, as her husband is now receiving chemotherapy and is intermittent and stages pancreatic cancer. Does the patient drive? No longer drives    Neuropsych evaluation July 09, 2022  Briefly, results suggested severe and quite diffuse cognitive impairment. Some performance variability was exhibited across visuospatial abilities. However, severe impairment was exhibited across all other domains able to be assessed given limited frustration tolerance. This includes processing speed, cognitive  flexibility, expressive language, and all aspects of learning and memory. Regarding etiology, I share concerns previously held by Dr. Roseanne Reno regarding early-onset Alzheimer's disease. Since her previous evaluation in June 2021, Ann Burgess has exhibited cognitive decline across all assessed cognitive domains. In some cases, decline is quite substantial. Specific to memory, Ann Burgess had notable trouble learning information despite repeated exposure and was essentially amnestic (i.e., 0% to 14% retention) across all memory tasks requiring the spontaneous provision of information. She also performed very poorly across all recognition trials. Taken together, this suggests evidence for rapid forgetting and a pronounced storage impairment, both of which are the hallmark characteristics of this illness.    History on Initial Assessment 02/25/2019: This is a pleasant 62 year old right-handed woman with no significant past medical history, presenting for evaluation of memory loss. She feels memory changes started 3-4 months ago, PCP notes indicate she started noticing it at the beginning of the year. 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 She has noticed word-finding difficulties where her husband would finish sentences for her. She denies getting lost driving. No missed bills. She denies misplacing things frequently or leaving the faucet running. She does not cook. She wonders about ADD, her 2 children take medication for it and her daughter thinks she has it. She does not when younger she had to work harder because she had difficulty concentrating. She feels her mood is good, she is just frustrated with not being able to remember things, and feels it is getting worse. No clear paranoia or hallucinations. She endorses her work has been very stressful, more stressful recently. Her parents have memory changes in  their 66s. She denies any history of significant head injuries. She rarely drinks alcohol.   She denies any headaches, dizziness, diplopia, dysarthria, dysphagia, neck/back pain, focal numbness/tingling/weakness, bowel/bladder dysfunction. No anosmia, tremors, no falls. Sleep is okay, when she is stressed with work, she may stay up because they "sit on my brain." She had bloodwork last month with B12 of 348, she has started B12 supplements and is not sure there is much difference.    I personally reviewed head CT without contrast done 10/2018 for headaches, no acute changes seen.   Diagnostic Data:  MRI brain without contrast done 03/2019 with no acute changes, normal exam.    Neuropsychological testing in June 2021 indicated substantial decline in memory abilities with additional low scores on measures of naming, semantic fluency, and on select measures of processing speed and executive functioning. Memory problems do have a storage problem. Findings concerning for an incipient neurodegenerative condition, most likely Alzheimer's disease, very mild early onset dementia.    PREVIOUS MEDICATIONS:   CURRENT MEDICATIONS:  Outpatient Encounter Medications as of 12/25/2022  Medication Sig   memantine (NAMENDA) 5 MG tablet Take 1 tablet (5 mg at night) for 2 weeks,  then increase to 1 tablet (5 mg) twice a day   calcium carbonate (CALCIUM 600) 600 MG TABS tablet Take 600 mg by mouth 2 (two) times daily with a meal.   Cholecalciferol (VITAMIN D3) 125 MCG (5000 UT) capsule Take 1 capsule (5,000 Units total) by mouth daily.   donepezil (ARICEPT) 10 MG tablet Take 1 tablet (10 mg total) by mouth at bedtime.   Multiple Vitamins-Minerals (CENTRUM ADULTS PO) Take by mouth daily.   vitamin B-12 (CYANOCOBALAMIN) 1000 MCG tablet Take 1,000 mcg by mouth daily.   No facility-administered encounter medications on file as of 12/25/2022.       12/25/2022   11:00 AM 06/27/2022    6:00 PM 07/10/2021    8:00 AM  MMSE  - Mini Mental State Exam  Orientation to time 0 0 0  Orientation to Place 2 4 5   Registration 2 3 3   Attention/ Calculation 4 5 0  Recall 0 0 0  Language- name 2 objects 2 1 2   Language- repeat 1 1 1   Language- follow 3 step command 3 3 3   Language- read & follow direction 1 1 1   Write a sentence 1 1 1   Copy design 1 1 0  Total score 17 20 16       11/11/2019    9:00 AM 02/25/2019   10:00 AM  Montreal Cognitive Assessment   Visuospatial/ Executive (0/5) 2 4  Naming (0/3) 3 3  Attention: Read list of digits (0/2) 2 2  Attention: Read list of letters (0/1) 1 1  Attention: Serial 7 subtraction starting at 100 (0/3) 2 2  Language: Repeat phrase (0/2) 1 2  Language : Fluency (0/1) 1 0  Abstraction (0/2) 1 2  Delayed Recall (0/5) 0 0  Orientation (0/6) 6 6  Total 19 22  Adjusted Score (based on education) 19     Objective:     PHYSICAL EXAMINATION:    VITALS:   Vitals:   12/25/22 0909  BP: 104/62  Resp: 18  SpO2: 97%  Weight: 156 lb (70.8 kg)  Height: 6' (1.829 m)    GEN:  The patient appears stated age and is in NAD. HEENT:  Normocephalic, atraumatic.   Neurological examination:  General: NAD, well-groomed, appears stated age. Orientation: The patient is alert. Oriented to person, place and date Cranial nerves: There is good facial symmetry.The speech is fluent and clear. No aphasia or dysarthria. Fund of knowledge is reduced. Recent and remote memory are impaired. Attention and concentration are reduced.  Able to name objects and repeat phrases.  Hearing is intact to conversational tone.   Sensation: Sensation is intact to light touch throughout Motor: Strength is at least antigravity x4. DTR's 2/4 in UE/LE     Movement examination: Tone: There is normal tone in the UE/LE Abnormal movements:  no tremor.  No myoclonus.  No asterixis.   Coordination:  There is no decremation with RAM's. Normal finger to nose  Gait and Station: The patient has no difficulty  arising out of a deep-seated chair without the use of the hands. The patient's stride length is good.  Gait is cautious and narrow.    Thank you for allowing [place] the opportunity to participate in the care of this nice patient. Please do not hesitate to contact us for any questions or concerns.   Total time spent on today's visit was 38 minutes dedicated to this patient today, preparing to see patient, examining the patient, ordering tests and/or medications and  counseling the patient, documenting clinical information in the EHR or other health record, independently interpreting results and communicating results to the patient/family, discussing treatment and goals, answering patient's questions and coordinating care.  Cc:  Mliss Sax, MD  Marlowe Kays 12/25/2022 11:21 AM

## 2022-12-25 NOTE — Patient Instructions (Signed)
Good to see you. Continue Donepezil 10mg  daily.   Start Memantine 5 mg at night for 2 weeks then increase to 5 mg twice a day Referral to [facility]     FALL PRECAUTIONS: Be cautious when walking. Scan the area for obstacles that may increase the risk of trips and falls. When getting up in the mornings, sit up at the edge of the bed for a few minutes before getting out of bed. Consider elevating the bed at the head end to avoid drop of blood pressure when getting up. Walk always in a well-lit room (use night lights in the walls). Avoid area rugs or power cords from appliances in the middle of the walkways. Use a walker or a cane if necessary and consider physical therapy for balance exercise. Get your eyesight checked regularly.  HOME SAFETY: Consider the safety of the kitchen when operating appliances like stoves, microwave oven, and blender. Consider having supervision and share cooking responsibilities until no longer able to participate in those. Accidents with firearms and other hazards in the house should be identified and addressed as well.  ABILITY TO BE LEFT ALONE: If patient is unable to contact 911 operator, consider using [facility], or when the need is there, arrange for someone to stay with patients. Smoking is a fire hazard, consider supervision or cessation. Risk of wandering should be assessed by caregiver and if detected at any point, supervision and safe proof recommendations should be instituted.   RECOMMENDATIONS FOR ALL PATIENTS WITH MEMORY PROBLEMS: 1. Continue to exercise (Recommend 30 minutes of walking everyday, or 3 hours every week) 2. Increase social interactions - continue going to [place] and enjoy social gatherings with friends and family 3. Eat healthy, avoid fried foods and eat more fruits and vegetables 4. Maintain adequate blood pressure, blood sugar, and blood cholesterol level. Reducing the risk of stroke and cardiovascular disease also helps promoting better memory. 5.  Avoid stressful situations. Live a simple life and avoid aggravations. Organize your time and prepare for the next day in anticipation. 6. Sleep well, avoid any interruptions of sleep and avoid any distractions in the bedroom that may interfere with adequate sleep quality 7. Avoid sugar, avoid sweets as there is a strong link between excessive sugar intake, diabetes, and cognitive impairment We discussed the Mediterranean diet, which has been shown to help patients reduce the risk of progressive memory disorders and reduces cardiovascular risk. This includes eating fish, eat fruits and green leafy vegetables, nuts like almonds and hazelnuts, walnuts, and also use olive oil. Avoid fast foods and fried foods as much as possible. Avoid sweets and sugar as sugar use has been linked to worsening of memory function.  There is always a concern of gradual progression of memory problems. If this is the case, then we may need to adjust level of care according to patient needs. Support, both to the patient and caregiver, should then be put into place.       Mediterranean Diet  Why follow it? Research shows. Those who follow the Mediterranean diet have a reduced risk of heart disease  The diet is associated with a reduced incidence of Parkinson's and Alzheimer's diseases People following the diet may have longer life expectancies and lower rates of chronic diseases  The Dietary Guidelines for Americans recommends the Mediterranean diet as an eating plan to promote health and prevent disease  What Is the Mediterranean Diet?  Healthy eating plan based on typical foods and recipes of Mediterranean-style cooking The diet  is primarily a plant based diet; these foods should make up a majority of meals   Starches - Plant based foods should make up a majority of meals - They are an important sources of vitamins, minerals, energy, antioxidants, and fiber - Choose whole grains, foods high in fiber and minimally  processed items  - Typical grain sources include wheat, oats, barley, corn, brown rice, bulgar, farro, millet, polenta, couscous  - Various types of beans include chickpeas, lentils, fava beans, black beans, white beans   Fruits  Veggies - Large quantities of antioxidant rich fruits & veggies; 6 or more servings  - Vegetables can be eaten raw or lightly drizzled with oil and cooked  - Vegetables common to the traditional Mediterranean Diet include: artichokes, arugula, beets, broccoli, brussel sprouts, cabbage, carrots, celery, collard greens, cucumbers, eggplant, kale, leeks, lemons, lettuce, mushrooms, okra, onions, peas, peppers, potatoes, pumpkin, radishes, rutabaga, shallots, spinach, sweet potatoes, turnips, zucchini - Fruits common to the Mediterranean Diet include: apples, apricots, avocados, cherries, clementines, dates, figs, grapefruits, grapes, melons, nectarines, oranges, peaches, pears, pomegranates, strawberries, tangerines  Fats - Replace butter and margarine with healthy oils, such as olive oil, canola oil, and tahini  - Limit nuts to no more than a handful a day  - Nuts include walnuts, almonds, pecans, pistachios, pine nuts  - Limit or avoid candied, honey roasted or heavily salted nuts - Olives are central to the [employer] - can be eaten whole or used in a variety of dishes   Meats Protein - Limiting red meat: no more than a few times a month - When eating red meat: choose lean cuts and keep the portion to the size of deck of cards - Eggs: approx. 0 to 4 times a week  - Fish and lean poultry: at least 2 a week  - Healthy protein sources include, chicken, [place], lean beef, lamb - Increase intake of seafood such as tuna, salmon, trout, mackerel, shrimp, scallops - Avoid or limit high fat processed meats such as sausage and bacon  Dairy - Include moderate amounts of low fat dairy products  - Focus on healthy dairy such as fat free yogurt, skim milk, low or reduced fat  cheese - Limit dairy products higher in fat such as whole or 2% milk, cheese, ice cream  Alcohol - Moderate amounts of red wine is ok  - No more than 5 oz daily for women (all ages) and men older than age 24  - No more than 10 oz of wine daily for men younger than 33  Other - Limit sweets and other desserts  - Use herbs and spices instead of salt to flavor foods  - Herbs and spices common to the traditional Mediterranean Diet include: basil, bay leaves, chives, cloves, cumin, fennel, garlic, lavender, marjoram, mint, oregano, parsley, pepper, rosemary, sage, savory, sumac, tarragon, thyme   It's not just a diet, it's a lifestyle:  The Mediterranean diet includes lifestyle factors typical of those in the region  Foods, drinks and meals are best eaten with others and savored Daily physical activity is important for overall good health This could be strenuous exercise like running and aerobics This could also be more leisurely activities such as walking, housework, yard-work, or taking the stairs Moderation is the key; a balanced and healthy diet accommodates most foods and drinks Consider portion sizes and frequency of consumption of certain foods   Meal Ideas & Options:  Breakfast:  Whole wheat toast or whole wheat [place]  muffins with peanut butter & hard boiled egg Steel cut oats topped with apples & cinnamon and skim milk  Fresh fruit: banana, strawberries, melon, berries, peaches  Smoothies: strawberries, bananas, greek yogurt, peanut butter Low fat greek yogurt with blueberries and granola  Egg white omelet with spinach and mushrooms Breakfast couscous: whole wheat couscous, apricots, skim milk, cranberries  Sandwiches:  Hummus and grilled vegetables (peppers, zucchini, squash) on whole wheat bread   Grilled chicken on whole wheat pita with lettuce, tomatoes, cucumbers or tzatziki  [place] salad on whole wheat bread: tuna salad made with greek yogurt, olives, red peppers, capers, green  onions Garlic rosemary lamb pita: lamb sauted with garlic, rosemary, salt & pepper; add lettuce, cucumber, greek yogurt to pita - flavor with lemon juice and black pepper  Seafood:  Mediterranean grilled salmon, seasoned with garlic, basil, parsley, lemon juice and black pepper Shrimp, lemon, and spinach whole-grain pasta salad made with low fat greek yogurt  Seared scallops with lemon orzo  Seared tuna steaks seasoned salt, pepper, coriander topped with tomato mixture of olives, tomatoes, olive oil, minced garlic, parsley, green onions and cappers  Meats:  Herbed greek chicken salad with kalamata olives, cucumber, feta  Red bell peppers stuffed with spinach, bulgur, lean ground beef (or lentils) & topped with feta   Kebabs: skewers of chicken, tomatoes, onions, zucchini, squash  [place] burgers: made with red onions, mint, dill, lemon juice, feta cheese topped with roasted red peppers Vegetarian Cucumber salad: cucumbers, artichoke hearts, celery, red onion, feta cheese, tossed in olive oil & lemon juice  Hummus and whole grain pita points with a greek salad (lettuce, tomato, feta, olives, cucumbers, red onion) Lentil soup with celery, carrots made with vegetable broth, garlic, salt and pepper  Tabouli salad: parsley, bulgur, mint, scallions, cucumbers, tomato, radishes, lemon juice, olive oil, salt and pepper.

## 2023-02-17 DIAGNOSIS — K080 Exfoliation of teeth due to systemic causes: Secondary | ICD-10-CM | POA: Diagnosis not present

## 2023-02-25 DIAGNOSIS — G300 Alzheimer's disease with early onset: Secondary | ICD-10-CM | POA: Diagnosis not present

## 2023-02-25 DIAGNOSIS — F02B0 Dementia in other diseases classified elsewhere, moderate, without behavioral disturbance, psychotic disturbance, mood disturbance, and anxiety: Secondary | ICD-10-CM | POA: Diagnosis not present

## 2023-03-04 DIAGNOSIS — R92323 Mammographic fibroglandular density, bilateral breasts: Secondary | ICD-10-CM | POA: Diagnosis not present

## 2023-03-04 DIAGNOSIS — Z1231 Encounter for screening mammogram for malignant neoplasm of breast: Secondary | ICD-10-CM | POA: Diagnosis not present

## 2023-03-04 DIAGNOSIS — K080 Exfoliation of teeth due to systemic causes: Secondary | ICD-10-CM | POA: Diagnosis not present

## 2023-03-26 ENCOUNTER — Ambulatory Visit: Payer: Self-pay

## 2023-03-26 ENCOUNTER — Institutional Professional Consult (permissible substitution): Payer: Medicare Other | Admitting: Psychology

## 2023-04-02 ENCOUNTER — Encounter: Payer: Medicare Other | Admitting: Psychology

## 2023-08-07 DIAGNOSIS — Z0000 Encounter for general adult medical examination without abnormal findings: Secondary | ICD-10-CM | POA: Diagnosis not present

## 2023-08-07 DIAGNOSIS — F02B3 Dementia in other diseases classified elsewhere, moderate, with mood disturbance: Secondary | ICD-10-CM | POA: Diagnosis not present

## 2023-08-07 DIAGNOSIS — Z780 Asymptomatic menopausal state: Secondary | ICD-10-CM | POA: Diagnosis not present

## 2023-08-07 DIAGNOSIS — G300 Alzheimer's disease with early onset: Secondary | ICD-10-CM | POA: Diagnosis not present

## 2023-08-26 DIAGNOSIS — T50995A Adverse effect of other drugs, medicaments and biological substances, initial encounter: Secondary | ICD-10-CM | POA: Diagnosis not present

## 2023-08-26 DIAGNOSIS — G300 Alzheimer's disease with early onset: Secondary | ICD-10-CM | POA: Diagnosis not present

## 2023-08-26 DIAGNOSIS — F02B3 Dementia in other diseases classified elsewhere, moderate, with mood disturbance: Secondary | ICD-10-CM | POA: Diagnosis not present

## 2023-08-26 DIAGNOSIS — G4700 Insomnia, unspecified: Secondary | ICD-10-CM | POA: Diagnosis not present

## 2023-11-25 DIAGNOSIS — G300 Alzheimer's disease with early onset: Secondary | ICD-10-CM | POA: Diagnosis not present

## 2023-11-25 DIAGNOSIS — F02B11 Dementia in other diseases classified elsewhere, moderate, with agitation: Secondary | ICD-10-CM | POA: Diagnosis not present

## 2023-11-25 DIAGNOSIS — Z634 Disappearance and death of family member: Secondary | ICD-10-CM | POA: Diagnosis not present

## 2023-11-25 DIAGNOSIS — F02B2 Dementia in other diseases classified elsewhere, moderate, with psychotic disturbance: Secondary | ICD-10-CM | POA: Diagnosis not present

## 2024-04-02 NOTE — Progress Notes (Signed)
 Ann Burgess                                          MRN: [id number]   04/02/2024   The [facility] Quality Team Specialist reviewed this patient medical record for the purposes of chart review for care gap closure. The following were reviewed: chart review for care gap closure-colorectal cancer screening.    [facility] Quality Team
